# Patient Record
Sex: Female | Born: 1960 | Hispanic: Yes | Smoking: Never smoker
Health system: Southern US, Community
[De-identification: ages and names within clinical notes are randomized; demographics above are authoritative.]

## PROBLEM LIST (undated history)

## (undated) DIAGNOSIS — I10 Essential (primary) hypertension: Secondary | ICD-10-CM

## (undated) DIAGNOSIS — E119 Type 2 diabetes mellitus without complications: Secondary | ICD-10-CM

## (undated) DIAGNOSIS — F329 Major depressive disorder, single episode, unspecified: Secondary | ICD-10-CM

## (undated) DIAGNOSIS — E039 Hypothyroidism, unspecified: Secondary | ICD-10-CM

## (undated) DIAGNOSIS — T7840XA Allergy, unspecified, initial encounter: Secondary | ICD-10-CM

## (undated) DIAGNOSIS — F32A Depression, unspecified: Secondary | ICD-10-CM

## (undated) HISTORY — DX: Depression, unspecified: F32.A

## (undated) HISTORY — DX: Type 2 diabetes mellitus without complications: E11.9

## (undated) HISTORY — DX: Hypothyroidism, unspecified: E03.9

## (undated) HISTORY — DX: Allergy, unspecified, initial encounter: T78.40XA

## (undated) HISTORY — DX: Major depressive disorder, single episode, unspecified: F32.9

## (undated) HISTORY — DX: Essential (primary) hypertension: I10

---

## 2014-10-26 ENCOUNTER — Encounter: Payer: Self-pay | Admitting: Family Medicine

## 2014-10-26 ENCOUNTER — Ambulatory Visit: Payer: Self-pay | Attending: Family Medicine | Admitting: Family Medicine

## 2014-10-26 VITALS — BP 144/96 | HR 73 | Temp 98.8°F | Resp 16 | Ht 64.0 in | Wt 151.0 lb

## 2014-10-26 DIAGNOSIS — F329 Major depressive disorder, single episode, unspecified: Secondary | ICD-10-CM

## 2014-10-26 DIAGNOSIS — Z114 Encounter for screening for human immunodeficiency virus [HIV]: Secondary | ICD-10-CM

## 2014-10-26 DIAGNOSIS — E039 Hypothyroidism, unspecified: Secondary | ICD-10-CM

## 2014-10-26 DIAGNOSIS — I10 Essential (primary) hypertension: Secondary | ICD-10-CM

## 2014-10-26 DIAGNOSIS — E119 Type 2 diabetes mellitus without complications: Secondary | ICD-10-CM

## 2014-10-26 DIAGNOSIS — E1169 Type 2 diabetes mellitus with other specified complication: Secondary | ICD-10-CM

## 2014-10-26 DIAGNOSIS — E1165 Type 2 diabetes mellitus with hyperglycemia: Secondary | ICD-10-CM

## 2014-10-26 DIAGNOSIS — F32A Depression, unspecified: Secondary | ICD-10-CM | POA: Insufficient documentation

## 2014-10-26 DIAGNOSIS — E785 Hyperlipidemia, unspecified: Secondary | ICD-10-CM

## 2014-10-26 DIAGNOSIS — G44229 Chronic tension-type headache, not intractable: Secondary | ICD-10-CM

## 2014-10-26 DIAGNOSIS — J309 Allergic rhinitis, unspecified: Secondary | ICD-10-CM

## 2014-10-26 LAB — LIPID PANEL
CHOL/HDL RATIO: 4.7 ratio
CHOLESTEROL: 215 mg/dL — AB (ref 0–200)
HDL: 46 mg/dL (ref >=46)
LDL CALC: 145 mg/dL — AB (ref 0–99)
Triglycerides: 118 mg/dL (ref <150)
VLDL: 24 mg/dL (ref 0–40)

## 2014-10-26 LAB — COMPLETE METABOLIC PANEL WITH GFR
ALBUMIN: 4 g/dL (ref 3.5–5.2)
ALK PHOS: 100 U/L (ref 39–117)
ALT: 46 U/L — ABNORMAL HIGH (ref 0–35)
AST: 39 U/L — AB (ref 0–37)
BUN: 23 mg/dL (ref 6–23)
CO2: 23 mEq/L (ref 19–32)
CREATININE: 1.1 mg/dL (ref 0.50–1.10)
Calcium: 9.4 mg/dL (ref 8.4–10.5)
Chloride: 102 mEq/L (ref 96–112)
GFR, Est African American: 65 mL/min
GFR, Est Non African American: 57 mL/min — ABNORMAL LOW
Glucose, Bld: 88 mg/dL (ref 70–99)
POTASSIUM: 4.6 meq/L (ref 3.5–5.3)
Sodium: 137 mEq/L (ref 135–145)
Total Bilirubin: 0.5 mg/dL (ref 0.2–1.2)
Total Protein: 7.4 g/dL (ref 6.0–8.3)

## 2014-10-26 LAB — CBC
HCT: 36.7 % (ref 36.0–46.0)
Hemoglobin: 12.3 g/dL (ref 12.0–15.0)
MCH: 28.7 pg (ref 26.0–34.0)
MCHC: 33.5 g/dL (ref 30.0–36.0)
MCV: 85.7 fL (ref 78.0–100.0)
MPV: 9.6 fL (ref 8.6–12.4)
Platelets: 372 10*3/uL (ref 150–400)
RBC: 4.28 MIL/uL (ref 3.87–5.11)
RDW: 13.9 % (ref 11.5–15.5)
WBC: 6.3 10*3/uL (ref 4.0–10.5)

## 2014-10-26 LAB — POCT GLYCOSYLATED HEMOGLOBIN (HGB A1C): Hemoglobin A1C: 6.3

## 2014-10-26 LAB — GLUCOSE, POCT (MANUAL RESULT ENTRY): POC Glucose: 111 mg/dl — AB (ref 70–99)

## 2014-10-26 LAB — TSH: TSH: 4.115 u[IU]/mL (ref 0.350–4.500)

## 2014-10-26 MED ORDER — TRIAMCINOLONE ACETONIDE 55 MCG/ACT NA AERO: 2.0000 | INHALATION_SPRAY | Freq: Every day | NASAL | Status: DC

## 2014-10-26 MED ORDER — GLUCOSE BLOOD VI STRP: 1.0000 | ORAL_STRIP | Freq: Three times a day (TID) | Status: DC

## 2014-10-26 MED ORDER — TRUEPLUS LANCETS 28G MISC: 1.0000 | Freq: Three times a day (TID) | Status: AC

## 2014-10-26 MED ORDER — PREGABALIN 50 MG PO CAPS: 50.0000 mg | ORAL_CAPSULE | Freq: Two times a day (BID) | ORAL | Status: DC

## 2014-10-26 MED ORDER — TRUERESULT BLOOD GLUCOSE W/DEVICE KIT: 1.0000 | PACK | Freq: Three times a day (TID) | Status: AC

## 2014-10-26 MED ORDER — FEXOFENADINE HCL 180 MG PO TABS: 180.0000 mg | ORAL_TABLET | Freq: Every day | ORAL | Status: DC

## 2014-10-26 NOTE — Progress Notes (Signed)
Patient here to establish care She has not been checking her sugars as her meter is in storage for last several months Due for pap and mammogram Patient c/o of headaches and seasonal allergies Needs referral for dentist [facility] is in process

## 2014-10-26 NOTE — Assessment & Plan Note (Signed)
Diabetes: A1c and blood sugar today are at goal. Continue metformin. Low carb diet  Regular exercise  Monitor sugars, new supplies sent

## 2014-10-26 NOTE — Assessment & Plan Note (Signed)
Headaches: tension type headache. Suspect chronic anxiety and depression is main trigger.  Plan: Check vit D level lyrica 50 mg TID

## 2014-10-26 NOTE — Assessment & Plan Note (Signed)
Allergic rhinitis: nasacort Allegra Stop hydroxyzine

## 2014-10-26 NOTE — Assessment & Plan Note (Signed)
A: on synthroid P: TSH today

## 2014-10-26 NOTE — Progress Notes (Signed)
   Subjective:    Patient ID: Linda George, female    DOB: 09-12-1960, 54 y.o.   MRN: [id number] CC: establish care, DM2, HTN, headaches  HPI  1. DM2: taking metformin [phone] mg BID. No checking sugars. Need diabetic eye exam.   2. HTN: taking HCTZ and losartan at night. ROS as per above. Not compliant with low salt diet.  3. Allergic rhinitis: taking hydroxyzine. Coughing and congested x 2 weeks. Took claritin in the past it did not help. Took zyrtec for many years it helped then stopped helping. Never tried nasal steroid.   4. Headaches: b/l temples since 03/2014. Daily. Moderate to severe. Better with excedrin and trial of lyrica. Worse with stress. Has hx of anxiety and depressing. Treated at [facility] with wellbutring and prozac. ROS as per below.   Soc Hx: non smoker  Med Hx: DM2 dx in  Fam Hx: DM2 in parents  Review of Systems  Constitutional: Negative for fever, chills and activity change.  HENT: Positive for congestion and sinus pressure. Negative for ear discharge, ear pain, facial swelling, hearing loss and mouth sores.   Eyes: Positive for visual disturbance.  Respiratory: Negative.   Cardiovascular: Negative.   Gastrointestinal: Negative.   Genitourinary: Negative.   Skin: Negative.   Neurological: Positive for light-headedness and headaches. Negative for dizziness, tremors, speech difficulty, weakness and numbness.  Psychiatric/Behavioral: Positive for dysphoric mood. The patient is nervous/anxious.    GAD-7: score of  12. 2-1,3,7. 3-2,3.     Objective:   Physical Exam BP 144/96 mmHg  Pulse 73  Temp(Src) 98.8 F (37.1 C)  Resp 16  Ht 5\' 4"  (1.626 m)  Wt 151 lb (68.493 kg)  BMI 25.91 kg/m2  SpO2 97% General appearance: alert, cooperative and no distress Head: Normocephalic, without obvious abnormality, atraumatic Eyes: conjunctivae/corneas clear. PERRL, EOM's intact.  Ears: normal TM's and external ear canals both ears Nose: no discharge, turbinates pink,  swollen  Throat: normal findings: lips normal without lesions and oropharynx pink & moist without lesions or evidence of thrush and abnormal findings: dentition: poor and periodontitis Neck: no adenopathy, supple, symmetrical, trachea midline and thyroid not enlarged, symmetric, no tenderness/mass/nodules Lungs: clear to auscultation bilaterally Heart: regular rate and rhythm, S1, S2 normal, no murmur, click, rub or gallop Extremities: extremities normal, atraumatic, no cyanosis or edema Pulses: 2+ and symmetric Skin: Skin color, texture, turgor normal. No rashes or lesions Neurologic: Alert and oriented X 3, normal strength and tone. Normal symmetric reflexes. Normal coordination and gait   Lab Results  Component Value Date   HGBA1C 6.30 10/26/2014   CBG 111       Assessment & Plan:

## 2014-10-26 NOTE — Patient Instructions (Addendum)
Linda George,  Thank you for coming in today. It was a pleasure meeting you. I look forward to being your primary doctor.   1. Diabetes: A1c and blood sugar today are at goal. Continue metformin. Low carb diet  Regular exercise  Monitor sugars, new supplies sent   Diabetes  Check blood sugar 2-3 times per day  Goal fasting 90-110 Goal after eating < 160 Beware of hypoglycemia (low blood sugar) which is blood sugar < 70 with or without symptoms  Diet Recommendations for Diabetes   Starchy (carb) foods include: Bread, rice, pasta, potatoes, corn, crackers, bagels, muffins, all baked goods.   Protein foods include: Meat, fish, poultry, eggs, dairy foods, and beans such as pinto and kidney beans (beans also provide carbohydrate).   1. Eat at least 3 meals and 1-2 snacks per day. Never go more than 4-5 hours while awake without eating.  2. Limit starchy foods to TWO per meal and ONE per snack. ONE portion of a starchy  food is equal to the following:   - ONE slice of bread (or its equivalent, such as half of a hamburger bun).   - 1/2 cup of a "scoopable" starchy food such as potatoes or rice.   - 1 OUNCE (28 grams) of starchy snack foods such as crackers or pretzels (look on label).   - 15 grams of carbohydrate as shown on food label.  3. Both lunch and dinner should include a protein food, a carb food, and vegetables.   - Obtain twice as many veg's as protein or carbohydrate foods for both lunch and dinner.   - Try to keep frozen veg's on hand for a quick vegetable serving.     - Fresh or frozen veg's are best.  4. Breakfast should always include protein.     2. HTN: goal is < 140/90  Continue HCTZ 25 mg daily Continue losartan 50 mg daily  DASH diet   3. Headaches: tension type headache. Suspect chronic anxiety and depression is main trigger.  Plan: Check vit D level lyrica 50 mg TID  4. Allergic rhinitis: nasacort Allegra Stop hydroxyzine   F/u in 4 weeks for pap  smear and discuss foot pain   Dr. Adrian Blackwater   DASH Eating Plan DASH stands for "Dietary Approaches to Stop Hypertension." The DASH eating plan is a healthy eating plan that has been shown to reduce high blood pressure (hypertension). Additional health benefits may include reducing the risk of type 2 diabetes mellitus, heart disease, and stroke. The DASH eating plan may also help with weight loss. WHAT DO I NEED TO KNOW ABOUT THE DASH EATING PLAN? For the DASH eating plan, you will follow these general guidelines:  Choose foods with a percent daily value for sodium of less than 5% (as listed on the food label).  Use salt-free seasonings or herbs instead of table salt or sea salt.  Check with your health care provider or pharmacist before using salt substitutes.  Eat lower-sodium products, often labeled as "lower sodium" or "no salt added."  Eat fresh foods.  Eat more vegetables, fruits, and low-fat dairy products.  Choose whole grains. Look for the word "whole" as the first word in the ingredient list.  Choose fish and skinless chicken or [place] more often than red meat. Limit fish, poultry, and meat to 6 oz (170 g) each day.  Limit sweets, desserts, sugars, and sugary drinks.  Choose heart-healthy fats.  Limit cheese to 1 oz (28 g) per day.  Eat more home-cooked food and less restaurant, buffet, and fast food.  Limit fried foods.  Cook foods using methods other than frying.  Limit canned vegetables. If you do use them, rinse them well to decrease the sodium.  When eating at a restaurant, ask that your food be prepared with less salt, or no salt if possible. WHAT FOODS CAN I EAT? Seek help from a dietitian for individual calorie needs. Grains Whole grain or whole wheat bread. Brown rice. Whole grain or whole wheat pasta. Quinoa, bulgur, and whole grain cereals. Low-sodium cereals. Corn or whole wheat flour tortillas. Whole grain cornbread. Whole grain crackers. Low-sodium  crackers. Vegetables Fresh or frozen vegetables (raw, steamed, roasted, or grilled). Low-sodium or reduced-sodium tomato and vegetable juices. Low-sodium or reduced-sodium tomato sauce and paste. Low-sodium or reduced-sodium canned vegetables.  Fruits All fresh, canned (in natural juice), or frozen fruits. Meat and Other Protein Products Ground beef (85% or leaner), grass-fed beef, or beef trimmed of fat. Skinless chicken or [place]. Ground chicken or [place]. Pork trimmed of fat. All fish and seafood. Eggs. Dried beans, peas, or lentils. Unsalted nuts and seeds. Unsalted canned beans. Dairy Low-fat dairy products, such as skim or 1% milk, 2% or reduced-fat cheeses, low-fat ricotta or cottage cheese, or plain low-fat yogurt. Low-sodium or reduced-sodium cheeses. Fats and Oils Tub margarines without trans fats. Light or reduced-fat mayonnaise and salad dressings (reduced sodium). Avocado. Safflower, olive, or canola oils. Natural peanut or almond butter. Other Unsalted popcorn and pretzels. The items listed above may not be a complete list of recommended foods or beverages. Contact your dietitian for more options. WHAT FOODS ARE NOT RECOMMENDED? Grains White bread. White pasta. White rice. Refined cornbread. Bagels and croissants. Crackers that contain trans fat. Vegetables Creamed or fried vegetables. Vegetables in a cheese sauce. Regular canned vegetables. Regular canned tomato sauce and paste. Regular tomato and vegetable juices. Fruits Dried fruits. Canned fruit in light or heavy syrup. Fruit juice. Meat and Other Protein Products Fatty cuts of meat. Ribs, chicken wings, bacon, sausage, bologna, salami, chitterlings, fatback, hot dogs, bratwurst, and packaged luncheon meats. Salted nuts and seeds. Canned beans with salt. Dairy Whole or 2% milk, cream, half-and-half, and cream cheese. Whole-fat or sweetened yogurt. Full-fat cheeses or blue cheese. Nondairy creamers and whipped toppings.  Processed cheese, cheese spreads, or cheese curds. Condiments Onion and garlic salt, seasoned salt, table salt, and sea salt. Canned and packaged gravies. Worcestershire sauce. Tartar sauce. Barbecue sauce. Teriyaki sauce. Soy sauce, including reduced sodium. Steak sauce. Fish sauce. Oyster sauce. Cocktail sauce. Horseradish. Ketchup and mustard. Meat flavorings and tenderizers. Bouillon cubes. Hot sauce. Tabasco sauce. Marinades. Taco seasonings. Relishes. Fats and Oils Butter, stick margarine, lard, shortening, ghee, and bacon fat. Coconut, palm kernel, or palm oils. Regular salad dressings. Other Pickles and olives. Salted popcorn and pretzels. The items listed above may not be a complete list of foods and beverages to avoid. Contact your dietitian for more information. WHERE CAN I FIND MORE INFORMATION? National Heart, Lung, and Blood Institute: travelstabloid.com Document Released: 07/09/2011 Document Revised: 12/04/2013 Document Reviewed: 05/24/2013 [facility] Patient Information 2015 [place], [place]. This information is not intended to replace advice given to you by your health care provider. Make sure you discuss any questions you have with your health care provider.

## 2014-10-26 NOTE — Assessment & Plan Note (Addendum)
Screening HIV  Screening HIV, negative

## 2014-10-26 NOTE — Assessment & Plan Note (Signed)
A: HTN: goal is < 140/90. Just above goal  P:  Continue HCTZ 25 mg daily Continue losartan 50 mg daily  DASH diet

## 2014-10-27 LAB — MICROALBUMIN / CREATININE URINE RATIO
CREATININE, URINE: 95 mg/dL
MICROALB UR: 0.7 mg/dL (ref <2.0)
Microalb Creat Ratio: 7.4 mg/g (ref 0.0–30.0)

## 2014-10-27 LAB — HIV ANTIBODY (ROUTINE TESTING W REFLEX): HIV 1&2 Ab, 4th Generation: NONREACTIVE

## 2014-11-01 ENCOUNTER — Ambulatory Visit: Payer: Self-pay | Attending: Internal Medicine

## 2014-11-01 DIAGNOSIS — E1169 Type 2 diabetes mellitus with other specified complication: Secondary | ICD-10-CM | POA: Insufficient documentation

## 2014-11-01 DIAGNOSIS — E785 Hyperlipidemia, unspecified: Secondary | ICD-10-CM

## 2014-11-01 MED ORDER — ATORVASTATIN CALCIUM 40 MG PO TABS: 40.0000 mg | ORAL_TABLET | Freq: Every day | ORAL | Status: DC

## 2014-11-01 MED ORDER — LEVOTHYROXINE SODIUM 25 MCG PO TABS: 25.0000 ug | ORAL_TABLET | Freq: Every day | ORAL | Status: DC

## 2014-11-01 NOTE — Assessment & Plan Note (Addendum)
A: elevated lipids with DM2 and hypothyroidism, high risk for CAD  P: lipitor 40 mg daily

## 2014-11-01 NOTE — Addendum Note (Signed)
Addended by: Boykin Nearing on: 11/01/2014 09:16 AM   Modules accepted: Orders

## 2014-11-07 ENCOUNTER — Telehealth: Payer: Self-pay | Admitting: *Deleted

## 2014-11-07 DIAGNOSIS — E785 Hyperlipidemia, unspecified: Principal | ICD-10-CM

## 2014-11-07 DIAGNOSIS — E1169 Type 2 diabetes mellitus with other specified complication: Secondary | ICD-10-CM

## 2014-11-07 NOTE — Telephone Encounter (Signed)
Pt requesting Rx send to [facility]  Phone # [phone]  Pt aware of results

## 2014-11-07 NOTE — Telephone Encounter (Signed)
-----   Message from Boykin Nearing, MD sent at 11/01/2014  9:14 AM EDT ----- Slightly elevated AST/ALT (liver enzymes)  just slight, will monitor  Normal CBC, TSH, screening HIV negative  Elevated cholesterol will need statin given patient also has DM2 to help prevent heart attack and stroke, lipitor 40 mg daily

## 2014-11-12 ENCOUNTER — Other Ambulatory Visit: Payer: Self-pay | Admitting: *Deleted

## 2014-11-12 DIAGNOSIS — E039 Hypothyroidism, unspecified: Secondary | ICD-10-CM

## 2014-11-12 DIAGNOSIS — G44229 Chronic tension-type headache, not intractable: Secondary | ICD-10-CM

## 2014-11-12 MED ORDER — LEVOTHYROXINE SODIUM 25 MCG PO TABS: 25.0000 ug | ORAL_TABLET | Freq: Every day | ORAL | Status: DC

## 2014-11-12 MED ORDER — PREGABALIN 50 MG PO CAPS: 50.0000 mg | ORAL_CAPSULE | Freq: Two times a day (BID) | ORAL | Status: DC

## 2014-11-23 ENCOUNTER — Ambulatory Visit: Payer: Self-pay | Admitting: Family Medicine

## 2015-01-03 ENCOUNTER — Ambulatory Visit: Payer: Self-pay

## 2015-01-03 ENCOUNTER — Encounter: Payer: Self-pay | Admitting: Family Medicine

## 2015-01-03 ENCOUNTER — Telehealth: Payer: Self-pay | Admitting: Family Medicine

## 2015-01-03 ENCOUNTER — Ambulatory Visit: Payer: No Typology Code available for payment source | Attending: Family Medicine | Admitting: Family Medicine

## 2015-01-03 VITALS — BP 133/85 | HR 77 | Temp 98.6°F | Resp 18 | Ht 64.0 in | Wt 149.0 lb

## 2015-01-03 DIAGNOSIS — I10 Essential (primary) hypertension: Secondary | ICD-10-CM

## 2015-01-03 DIAGNOSIS — E1169 Type 2 diabetes mellitus with other specified complication: Secondary | ICD-10-CM

## 2015-01-03 DIAGNOSIS — E119 Type 2 diabetes mellitus without complications: Secondary | ICD-10-CM

## 2015-01-03 DIAGNOSIS — E039 Hypothyroidism, unspecified: Secondary | ICD-10-CM | POA: Diagnosis not present

## 2015-01-03 DIAGNOSIS — R51 Headache: Secondary | ICD-10-CM | POA: Insufficient documentation

## 2015-01-03 DIAGNOSIS — J309 Allergic rhinitis, unspecified: Secondary | ICD-10-CM

## 2015-01-03 DIAGNOSIS — Z0000 Encounter for general adult medical examination without abnormal findings: Secondary | ICD-10-CM

## 2015-01-03 DIAGNOSIS — G44229 Chronic tension-type headache, not intractable: Secondary | ICD-10-CM

## 2015-01-03 DIAGNOSIS — R42 Dizziness and giddiness: Secondary | ICD-10-CM

## 2015-01-03 DIAGNOSIS — E785 Hyperlipidemia, unspecified: Secondary | ICD-10-CM

## 2015-01-03 LAB — TSH: TSH: 17.844 u[IU]/mL — ABNORMAL HIGH (ref 0.350–4.500)

## 2015-01-03 LAB — GLUCOSE, POCT (MANUAL RESULT ENTRY): POC Glucose: 226 mg/dl — AB (ref 70–99)

## 2015-01-03 LAB — POCT GLYCOSYLATED HEMOGLOBIN (HGB A1C): Hemoglobin A1C: 6.3

## 2015-01-03 MED ORDER — LEVOTHYROXINE SODIUM 25 MCG PO TABS: 25.0000 ug | ORAL_TABLET | Freq: Every day | ORAL | Status: DC

## 2015-01-03 MED ORDER — METFORMIN HCL 1000 MG PO TABS: 500.0000 mg | ORAL_TABLET | Freq: Two times a day (BID) | ORAL | Status: DC

## 2015-01-03 MED ORDER — LOSARTAN POTASSIUM 50 MG PO TABS: 50.0000 mg | ORAL_TABLET | Freq: Every day | ORAL | Status: DC

## 2015-01-03 MED ORDER — FLUTICASONE PROPIONATE 50 MCG/ACT NA SUSP: 2.0000 | Freq: Every day | NASAL | Status: DC

## 2015-01-03 MED ORDER — ATORVASTATIN CALCIUM 40 MG PO TABS: 40.0000 mg | ORAL_TABLET | Freq: Every day | ORAL | Status: DC

## 2015-01-03 NOTE — Progress Notes (Signed)
F/U DM Complaining HA daily and dizziness

## 2015-01-03 NOTE — Telephone Encounter (Signed)
Patient called requesting to receive medications filled every 3 months instead of every month. Please f/u with patient

## 2015-01-03 NOTE — Patient Instructions (Addendum)
Linda George,  Thank you for coming in today  1. Diabetes well controlled  2. HTN well controlled  3. Headache with intermittent dizziness: normal neuro exam Opthalmology referral Check vit D Ordered flonase   4. Hypothyroidism: Synthroid refilled  TSH today  F/u in 3 months for HTN, diabetes  Dr. Adrian Blackwater

## 2015-01-03 NOTE — Progress Notes (Signed)
   Subjective:    Patient ID: Linda George, female    DOB: 1960/10/19, 54 y.o.   MRN: [id number] CC: DM2 f.u, HA and dizziness  HPI 54 yo F with depression and history of chronic headaches presents for the following:  1. Headache: worsening over past 1 year. B/l temple. Tightness an sharp pains. Has hx of chronic headache for past 10 yrs. Worse in the morning and while sleeping. Has associated dizziness. Has blurred vision. Worse in R eye. Last eye exam 2-3 years ago. No associated fever, chills, nausea. excedrin and rest helps intermittently.   2. CHRONIC DIABETES  Disease Monitoring  Blood Sugar Ranges: not checking   Polyuria: no   Visual problems: no   Medication Compliance: intermittent Medication Side Effects  Hypoglycemia: no   Preventitive Health Care  Eye Exam: due   Foot Exam: done today   Diet pattern: regular meals   Exercise: no   3. HTN: compliant with regimen. Tolerating losartan w/o swelling. No CP or SOB.   Soc Hx: non smoker  Review of Systems     Objective:   Physical Exam BP 133/85 mmHg  Pulse 77  Temp(Src) 98.6 F (37 C) (Oral)  Resp 18  Ht 5\' 4"  (1.626 m)  Wt 149 lb (67.586 kg)  BMI 25.56 kg/m2  SpO2 100% General appearance: alert, cooperative and no distress Head: Normocephalic, without obvious abnormality, atraumatic Eyes: conjunctivae/corneas clear. PERRL, EOM's intact.  Ears: normal TM's and external ear canals both ears  Nose: turbinates pink and swollen, no discharge  Neck: no adenopathy, no JVD and thyroid not enlarged, symmetric, no tenderness/mass/nodules Lungs: clear to auscultation bilaterally Heart: regular rate and rhythm, S1, S2 normal, no murmur, click, rub or gallop  Lab Results  Component Value Date   HGBA1C 6.30 01/03/2015   CBG 229        Assessment & Plan:

## 2015-01-04 ENCOUNTER — Encounter: Payer: Self-pay | Admitting: Family Medicine

## 2015-01-04 DIAGNOSIS — Z0000 Encounter for general adult medical examination without abnormal findings: Secondary | ICD-10-CM | POA: Insufficient documentation

## 2015-01-04 LAB — VITAMIN D 25 HYDROXY (VIT D DEFICIENCY, FRACTURES): Vit D, 25-Hydroxy: 19 ng/mL — ABNORMAL LOW (ref 30–100)

## 2015-01-04 MED ORDER — VITAMIN D (ERGOCALCIFEROL) 1.25 MG (50000 UNIT) PO CAPS: 50000.0000 [IU] | ORAL_CAPSULE | ORAL | Status: DC

## 2015-01-04 NOTE — Assessment & Plan Note (Signed)
A: Headache with intermittent dizziness: normal neuro exam. Chronic tensio type headache P: Opthalmology referral Check vit D Ordered flonase

## 2015-01-04 NOTE — Assessment & Plan Note (Signed)
A: hypothyroidism with non-compliance with synthroid Lab Results  Component Value Date   TSH 17.844* 01/03/2015   P: Restart synthroid as previous dose

## 2015-01-04 NOTE — Assessment & Plan Note (Signed)
A; well controlled with intermittent compliance P: continue current regimen Ophthalmology referral

## 2015-01-04 NOTE — Assessment & Plan Note (Signed)
A: lightheaded in setting of untreated hypothyroidism and vit D deficiency P: Synthroid Vit D per orders

## 2015-01-04 NOTE — Assessment & Plan Note (Signed)
A: Blood pressure at goal. Meds: compliant  P: I will continue the patient's current medication regimen since her blood pressure is at goal.   

## 2015-01-07 MED ORDER — GLUCOSE BLOOD VI STRP: 1.0000 | ORAL_STRIP | Freq: Three times a day (TID) | Status: DC

## 2015-01-07 MED ORDER — FLUTICASONE PROPIONATE 50 MCG/ACT NA SUSP: 2.0000 | Freq: Every day | NASAL | Status: DC

## 2015-01-07 MED ORDER — METFORMIN HCL 1000 MG PO TABS: 500.0000 mg | ORAL_TABLET | Freq: Two times a day (BID) | ORAL | Status: DC

## 2015-01-07 MED ORDER — LOSARTAN POTASSIUM 50 MG PO TABS: 50.0000 mg | ORAL_TABLET | Freq: Every day | ORAL | Status: DC

## 2015-01-07 MED ORDER — FEXOFENADINE HCL 180 MG PO TABS: 180.0000 mg | ORAL_TABLET | Freq: Every day | ORAL | Status: DC

## 2015-01-07 MED ORDER — HYDROCHLOROTHIAZIDE 25 MG PO TABS: 25.0000 mg | ORAL_TABLET | Freq: Every day | ORAL | Status: DC

## 2015-01-07 MED ORDER — LEVOTHYROXINE SODIUM 25 MCG PO TABS: 25.0000 ug | ORAL_TABLET | Freq: Every day | ORAL | Status: DC

## 2015-01-14 ENCOUNTER — Telehealth: Payer: Self-pay | Admitting: *Deleted

## 2015-01-14 NOTE — Telephone Encounter (Signed)
-----   Message from Boykin Nearing, MD sent at 01/04/2015  8:42 AM EDT ----- Vit D is low, will replace  TSH is high, off synthroid x 2 weeks, synthroid restarted will need repeat TSH in 12 weeks

## 2015-01-14 NOTE — Telephone Encounter (Signed)
LVM to return call.

## 2015-01-15 NOTE — Telephone Encounter (Signed)
Pt called returning nurse's call °

## 2015-01-22 NOTE — Telephone Encounter (Signed)
Pt aware of result.

## 2015-03-18 ENCOUNTER — Telehealth: Payer: Self-pay | Admitting: Family Medicine

## 2015-03-18 DIAGNOSIS — F4310 Post-traumatic stress disorder, unspecified: Secondary | ICD-10-CM

## 2015-03-18 DIAGNOSIS — R5382 Chronic fatigue, unspecified: Secondary | ICD-10-CM

## 2015-03-18 NOTE — Telephone Encounter (Signed)
Patient called requesting to speak to PCP regarding disability letter, patient would like to know if we would be able to provide a letter for her. Please f/u

## 2015-03-19 ENCOUNTER — Ambulatory Visit
Admission: RE | Admit: 2015-03-19 | Discharge: 2015-03-19 | Disposition: A | Discharge status: Home / Self Care | Payer: Medicaid Other | Source: Ambulatory Visit | Attending: Family Medicine | Admitting: Family Medicine

## 2015-03-19 DIAGNOSIS — Z0000 Encounter for general adult medical examination without abnormal findings: Secondary | ICD-10-CM

## 2015-03-20 NOTE — Telephone Encounter (Signed)
Patient called requesting to speak to nurse regarding disability letter, patient would like to know if we would be able to provide one for her,please f/u

## 2015-03-27 ENCOUNTER — Encounter: Payer: Self-pay | Admitting: Family Medicine

## 2015-03-27 ENCOUNTER — Ambulatory Visit: Payer: Medicaid Other | Attending: Family Medicine | Admitting: Family Medicine

## 2015-03-27 ENCOUNTER — Other Ambulatory Visit: Payer: Self-pay | Admitting: Gastroenterology

## 2015-03-27 VITALS — BP 128/84 | HR 75 | Temp 98.7°F | Resp 16 | Ht 64.5 in | Wt 151.0 lb

## 2015-03-27 DIAGNOSIS — E559 Vitamin D deficiency, unspecified: Secondary | ICD-10-CM | POA: Insufficient documentation

## 2015-03-27 DIAGNOSIS — L659 Nonscarring hair loss, unspecified: Secondary | ICD-10-CM | POA: Diagnosis not present

## 2015-03-27 DIAGNOSIS — G44229 Chronic tension-type headache, not intractable: Secondary | ICD-10-CM

## 2015-03-27 DIAGNOSIS — E119 Type 2 diabetes mellitus without complications: Secondary | ICD-10-CM | POA: Diagnosis not present

## 2015-03-27 DIAGNOSIS — I10 Essential (primary) hypertension: Secondary | ICD-10-CM | POA: Diagnosis not present

## 2015-03-27 DIAGNOSIS — J309 Allergic rhinitis, unspecified: Secondary | ICD-10-CM

## 2015-03-27 DIAGNOSIS — E039 Hypothyroidism, unspecified: Secondary | ICD-10-CM

## 2015-03-27 LAB — TSH: TSH: 9.248 u[IU]/mL — ABNORMAL HIGH (ref 0.350–4.500)

## 2015-03-27 LAB — GLUCOSE, POCT (MANUAL RESULT ENTRY): POC Glucose: 176 mg/dl — AB (ref 70–99)

## 2015-03-27 MED ORDER — LEVOTHYROXINE SODIUM 25 MCG PO TABS: 25.0000 ug | ORAL_TABLET | Freq: Every day | ORAL | Status: DC

## 2015-03-27 MED ORDER — PREGABALIN 50 MG PO CAPS: 50.0000 mg | ORAL_CAPSULE | Freq: Two times a day (BID) | ORAL | Status: DC

## 2015-03-27 MED ORDER — HYDROCHLOROTHIAZIDE 25 MG PO TABS: 25.0000 mg | ORAL_TABLET | Freq: Every day | ORAL | Status: DC

## 2015-03-27 MED ORDER — CETIRIZINE HCL 10 MG PO TABS: 10.0000 mg | ORAL_TABLET | Freq: Every day | ORAL | Status: DC

## 2015-03-27 MED ORDER — FLUOXETINE HCL 40 MG PO CAPS: 40.0000 mg | ORAL_CAPSULE | Freq: Every day | ORAL | Status: DC

## 2015-03-27 MED ORDER — LOSARTAN POTASSIUM 50 MG PO TABS
50.0000 mg | ORAL_TABLET | Freq: Every day | ORAL | Status: DC
Start: 2015-03-27 — End: 2015-09-13

## 2015-03-27 MED ORDER — METFORMIN HCL 1000 MG PO TABS: 500.0000 mg | ORAL_TABLET | Freq: Two times a day (BID) | ORAL | Status: DC

## 2015-03-27 NOTE — Patient Instructions (Addendum)
Ms. Linda George, Jha to see you again All meds the same except change from allegra to zyrtec  Derm referral for hairloss  F/u next month for flu shot   F/u with me in 2 months for diabetes, hypothyroidism and HTN

## 2015-03-27 NOTE — Assessment & Plan Note (Signed)
A: well controlled with last A1c check. High sugar today following sugar sweetened cereal P: avoid sugar sweetened cereal  F/u A1c at next OV in 2 months

## 2015-03-27 NOTE — Progress Notes (Signed)
   Subjective:    Patient ID: Linda George, female    DOB: [birth year], 54 y.o.   MRN: [id number] CC: f/u HTN and DM2 HPI  1. CHRONIC HYPERTENSION  Disease Monitoring  Blood pressure range: not checking   Chest pain: no   Dyspnea: no   Claudication: no   Medication compliance: yes  Medication Side Effects  Lightheadedness: no   Urinary frequency: no   Edema: no     2. CHRONIC DIABETES  Disease Monitoring  Blood Sugar Ranges: not checking   Polyuria: no   Visual problems: no   Medication Compliance: yes  Medication Side Effects  Hypoglycemia: no    3. Applying for disability: chronic fatigue, dizziness, lightheadedness, forgetfulness x 2 years. Not working x 1 years. Has hypothyroidism and depression treated at [facility].   4. Hairloss: no scarring for many years. Unable to afford OTC treatment. Has hypothyroidism. Would like treatment for hairloss.   Social History  Substance Use Topics  . Smoking status: Never Smoker   . Smokeless tobacco: Never Used  . Alcohol Use: No   Review of Systems  Constitutional: Positive for fatigue. Negative for fever and chills.  Eyes: Negative for visual disturbance.  Respiratory: Negative for shortness of breath.   Cardiovascular: Negative for chest pain.  Gastrointestinal: Negative for abdominal pain and blood in stool.  Musculoskeletal: Positive for back pain. Negative for arthralgias.  Skin: Negative for rash.       Skin itching legs, arms, back, stomach   Allergic/Immunologic: Negative for immunocompromised state.  Neurological: Positive for dizziness and light-headedness.  Hematological: Negative for adenopathy. Does not bruise/bleed easily.  Psychiatric/Behavioral: Positive for dysphoric mood. Negative for suicidal ideas.  GAD-7: 15. 1-6, 2-2,4,5,7. 301,3.     Objective:   Physical Exam  Constitutional: She is oriented to person, place, and time. She appears well-developed and well-nourished. No distress.  HENT:  Head:  Normocephalic and atraumatic.  Thinning hair and scattered alopecia   Neck: No thyromegaly present.  Cardiovascular: Normal rate, regular rhythm, normal heart sounds and intact distal pulses.   Pulmonary/Chest: Effort normal and breath sounds normal.  Musculoskeletal: She exhibits no edema.  Lymphadenopathy:    She has no cervical adenopathy.  Neurological: She is alert and oriented to person, place, and time.  Skin: Skin is warm and dry. No rash noted.  Psychiatric: She has a normal mood and affect.  BP 128/84 mmHg  Pulse 75  Temp(Src) 98.7 F (37.1 C) (Oral)  Resp 16  Ht 5' 4.5" (1.638 m)  Wt 151 lb (68.493 kg)  BMI 25.53 kg/m2  SpO2 100%  Wt Readings from Last 3 Encounters:  03/27/15 151 lb (68.493 kg)  01/03/15 149 lb (67.586 kg)  10/26/14 151 lb (68.493 kg)    CBG 176 Lab Results  Component Value Date   HGBA1C 6.30 01/03/2015          Assessment & Plan:

## 2015-03-27 NOTE — Assessment & Plan Note (Signed)
Changes from allegra to zyrtec Plan for zyrtec D in allergy season prn

## 2015-03-27 NOTE — Assessment & Plan Note (Addendum)
Checking TSH and will adjust synthroid as needed.  TSH improved but still elevated increase synthroid to 50 mcg q AM

## 2015-03-27 NOTE — Progress Notes (Signed)
F/U HTN DM  Stated BP is been elevated 149/70 No Hx tobacco

## 2015-03-27 NOTE — Assessment & Plan Note (Signed)
A: BP well controlled Med: compliant P: continue HCTZ

## 2015-03-27 NOTE — Addendum Note (Signed)
Addended by: Boykin Nearing on: 03/27/2015 01:16 PM   Modules accepted: Orders

## 2015-03-27 NOTE — Assessment & Plan Note (Signed)
Derm referral placed

## 2015-03-28 LAB — VITAMIN D 25 HYDROXY (VIT D DEFICIENCY, FRACTURES): VIT D 25 HYDROXY: 37 ng/mL (ref 30–100)

## 2015-03-29 MED ORDER — LEVOTHYROXINE SODIUM 50 MCG PO TABS: 50.0000 ug | ORAL_TABLET | Freq: Every day | ORAL | Status: DC

## 2015-03-29 NOTE — Addendum Note (Signed)
Addended by: Boykin Nearing on: 03/29/2015 10:48 AM   Modules accepted: Orders

## 2015-04-01 ENCOUNTER — Telehealth: Payer: Self-pay | Admitting: Clinical

## 2015-04-01 NOTE — Telephone Encounter (Signed)
Left HIPPA-compliant to call back Roselyn Reef from [employer] at [phone], attempt to f/u w pt.

## 2015-04-10 DIAGNOSIS — F4310 Post-traumatic stress disorder, unspecified: Secondary | ICD-10-CM | POA: Insufficient documentation

## 2015-04-10 DIAGNOSIS — R5382 Chronic fatigue, unspecified: Secondary | ICD-10-CM | POA: Insufficient documentation

## 2015-04-10 NOTE — Telephone Encounter (Signed)
Please call patient, I do not determine long term disability but I have written a letter detailing her current active medical problems. Letter ready for pick up

## 2015-04-10 NOTE — Telephone Encounter (Signed)
Date of  Birth verified by Pt Lab results given to pt Notified Rx was increased. New Rx at [place]

## 2015-04-10 NOTE — Telephone Encounter (Signed)
-----   Message from Boykin Nearing, MD sent at 03/29/2015 10:44 AM EDT ----- Vit D normal TSH improved but still elevated increase synthroid to 50 mcg q AM

## 2015-04-10 NOTE — Telephone Encounter (Signed)
LVM to return call, Letter at front office    Please inform Pt letter is ready at front office

## 2015-06-04 ENCOUNTER — Encounter (HOSPITAL_COMMUNITY): Payer: Self-pay | Admitting: *Deleted

## 2015-06-11 ENCOUNTER — Encounter (HOSPITAL_COMMUNITY): Payer: Self-pay

## 2015-06-11 ENCOUNTER — Ambulatory Visit (HOSPITAL_COMMUNITY)
Admission: RE | Admit: 2015-06-11 | Discharge: 2015-06-11 | Disposition: A | Discharge status: Home / Self Care | Payer: Medicaid Other | Source: Ambulatory Visit | Attending: Gastroenterology | Admitting: Gastroenterology

## 2015-06-11 ENCOUNTER — Encounter (HOSPITAL_COMMUNITY)
Admission: RE | Disposition: A | Discharge status: Home / Self Care | Payer: Self-pay | Source: Ambulatory Visit | Attending: Gastroenterology

## 2015-06-11 ENCOUNTER — Ambulatory Visit (HOSPITAL_COMMUNITY): Payer: Medicaid Other | Admitting: Anesthesiology

## 2015-06-11 DIAGNOSIS — E119 Type 2 diabetes mellitus without complications: Secondary | ICD-10-CM | POA: Insufficient documentation

## 2015-06-11 DIAGNOSIS — E039 Hypothyroidism, unspecified: Secondary | ICD-10-CM | POA: Insufficient documentation

## 2015-06-11 DIAGNOSIS — Z1211 Encounter for screening for malignant neoplasm of colon: Secondary | ICD-10-CM | POA: Diagnosis not present

## 2015-06-11 DIAGNOSIS — F4310 Post-traumatic stress disorder, unspecified: Secondary | ICD-10-CM | POA: Diagnosis not present

## 2015-06-11 DIAGNOSIS — I10 Essential (primary) hypertension: Secondary | ICD-10-CM | POA: Insufficient documentation

## 2015-06-11 DIAGNOSIS — E7800 Pure hypercholesterolemia, unspecified: Secondary | ICD-10-CM | POA: Diagnosis not present

## 2015-06-11 DIAGNOSIS — D122 Benign neoplasm of ascending colon: Secondary | ICD-10-CM | POA: Diagnosis not present

## 2015-06-11 HISTORY — PX: COLONOSCOPY WITH PROPOFOL: SHX5780

## 2015-06-11 LAB — GLUCOSE, CAPILLARY: Glucose-Capillary: 88 mg/dL (ref 65–99)

## 2015-06-11 SURGERY — COLONOSCOPY WITH PROPOFOL
Anesthesia: Monitor Anesthesia Care

## 2015-06-11 MED ORDER — PROPOFOL 10 MG/ML IV BOLUS
INTRAVENOUS | Status: DC | PRN
  Administered 2015-06-11 (×3): 20 mg via INTRAVENOUS

## 2015-06-11 MED ORDER — PROPOFOL 10 MG/ML IV BOLUS
INTRAVENOUS | Status: AC
  Filled 2015-06-11: qty 20

## 2015-06-11 MED ORDER — LACTATED RINGERS IV SOLN
INTRAVENOUS | Status: DC
  Administered 2015-06-11: 12:00:00 via INTRAVENOUS

## 2015-06-11 MED ORDER — FENTANYL CITRATE (PF) 100 MCG/2ML IJ SOLN
INTRAMUSCULAR | Status: DC | PRN
  Administered 2015-06-11: 50 ug via INTRAVENOUS

## 2015-06-11 MED ORDER — FENTANYL CITRATE (PF) 100 MCG/2ML IJ SOLN
INTRAMUSCULAR | Status: AC
  Filled 2015-06-11: qty 4

## 2015-06-11 MED ORDER — SODIUM CHLORIDE 0.9 % IV SOLN: INTRAVENOUS | Status: DC

## 2015-06-11 MED ORDER — PROPOFOL 500 MG/50ML IV EMUL
INTRAVENOUS | Status: DC | PRN
  Administered 2015-06-11: 140 ug/kg/min via INTRAVENOUS

## 2015-06-11 SURGICAL SUPPLY — 21 items

## 2015-06-11 NOTE — Op Note (Signed)
Procedure: Screening colonoscopy  Endoscopist: Earle Gell  Premedication: Propofol administered by anesthesia  Procedure: The patient was placed in the left lateral decubitus position. Anal inspection and digital rectal exam were normal. The Pentax pediatric colonoscope was introduced into the rectum and advanced to the cecum. A normal-appearing ileocecal valve and appendiceal orifice were identified. Colonic preparation for the exam today was good. Withdrawal time was 13 minutes  Rectum. Normal. Retroflexed view of the distal rectum was normal  Sigmoid colon and descending colon. Normal  Splenic flexure. Normal  Transverse colon. Normal  Hepatic flexure. Normal  Ascending colon. From the proximal ascending colon, a 5 mm sessile polyp was removed with the cold snare  Cecum and ileocecal valve. Normal  Assessment: A small polyp was removed from the ascending colon. Otherwise normal colonoscopy  Recommendation: If the colon polyp returns adenomatous pathologically, schedule repeat colonoscopy in 5 years

## 2015-06-11 NOTE — Anesthesia Postprocedure Evaluation (Signed)
Anesthesia Post Note  Patient: Linda George  Procedure(s) Performed: Procedure(s) (LRB): COLONOSCOPY WITH PROPOFOL (N/A)  Anesthesia type: MAC  Patient location: PACU  Post pain: Pain level controlled  Post assessment: Patient's Cardiovascular Status Stable  Last Vitals:  Filed Vitals:   06/11/15 1355  BP:   Pulse: 63  Temp:   Resp: 15    Post vital signs: Reviewed and stable  Level of consciousness: sedated  Complications: No apparent anesthesia complications

## 2015-06-11 NOTE — H&P (Signed)
  Procedure: Screening colonoscopy  History: The patient is a 54 year old female born 1960-12-28. She is scheduled to undergo a screening colonoscopy today. There is no family history of colon cancer.  Medication allergies: ACE inhibitors  Past medical history: Depression. Hypertension. Type 2 diabetes mellitus. Hypothyroidism. Hypercholesterolemia. Cesarean section. Post traumatic stress disorder  Exam: The patient is alert and lying comfortably on the endoscopy stretcher. Abdomen is soft and nontender to palpation. Lungs are clear to auscultation. Cardiac exam reveals a regular rhythm.  Plan: Proceed with screening colonoscopy

## 2015-06-11 NOTE — Anesthesia Preprocedure Evaluation (Signed)
Anesthesia Evaluation  Patient identified by MRN, date of birth, ID band Patient awake    Reviewed: Allergy & Precautions, NPO status , Patient's Chart, lab work & pertinent test results  Airway Mallampati: I  TM Distance: >3 FB Neck ROM: Full    Dental   Pulmonary    Pulmonary exam normal        Cardiovascular hypertension, Pt. on medications Normal cardiovascular exam     Neuro/Psych Depression    GI/Hepatic   Endo/Other  diabetes, Type 2, Oral Hypoglycemic AgentsHypothyroidism   Renal/GU      Musculoskeletal   Abdominal   Peds  Hematology   Anesthesia Other Findings   Reproductive/Obstetrics                             Anesthesia Physical Anesthesia Plan  ASA: II  Anesthesia Plan: MAC   Post-op Pain Management:    Induction: Intravenous  Airway Management Planned: Natural Airway  Additional Equipment:   Intra-op Plan:   Post-operative Plan:   Informed Consent: I have reviewed the patients History and Physical, chart, labs and discussed the procedure including the risks, benefits and alternatives for the proposed anesthesia with the patient or authorized representative who has indicated his/her understanding and acceptance.     Plan Discussed with: CRNA and Surgeon  Anesthesia Plan Comments:         Anesthesia Quick Evaluation

## 2015-06-11 NOTE — Discharge Instructions (Signed)

## 2015-06-11 NOTE — Transfer of Care (Signed)
Immediate Anesthesia Transfer of Care Note  Patient: Linda George  Procedure(s) Performed: Procedure(s): COLONOSCOPY WITH PROPOFOL (N/A)  Patient Location: Endoscopy Unit  Anesthesia Type:MAC  Level of Consciousness: awake and alert   Airway & Oxygen Therapy: Patient Spontanous Breathing and Patient connected to face mask oxygen  Post-op Assessment: Report given to RN and Post -op Vital signs reviewed and stable  Post vital signs: Reviewed and stable  Last Vitals:  Filed Vitals:   06/11/15 1143  BP: 179/98  Pulse: 68  Temp: 36.7 C  Resp: 19    Complications: No apparent anesthesia complications

## 2015-06-12 ENCOUNTER — Encounter (HOSPITAL_COMMUNITY): Payer: Self-pay | Admitting: Gastroenterology

## 2015-06-18 ENCOUNTER — Telehealth: Payer: Self-pay | Admitting: *Deleted

## 2015-06-18 ENCOUNTER — Other Ambulatory Visit: Payer: Self-pay | Admitting: *Deleted

## 2015-06-18 DIAGNOSIS — G44229 Chronic tension-type headache, not intractable: Secondary | ICD-10-CM

## 2015-06-18 MED ORDER — PREGABALIN 50 MG PO CAPS: 50.0000 mg | ORAL_CAPSULE | Freq: Two times a day (BID) | ORAL | Status: DC

## 2015-06-18 NOTE — Telephone Encounter (Signed)
LVM to return call   (Pharmacy of choice)

## 2015-09-13 ENCOUNTER — Ambulatory Visit (HOSPITAL_BASED_OUTPATIENT_CLINIC_OR_DEPARTMENT_OTHER): Payer: Medicaid Other | Admitting: Clinical

## 2015-09-13 ENCOUNTER — Ambulatory Visit: Payer: Medicaid Other | Attending: Family Medicine | Admitting: Family Medicine

## 2015-09-13 ENCOUNTER — Encounter: Payer: Self-pay | Admitting: Family Medicine

## 2015-09-13 VITALS — BP 161/90 | HR 77 | Temp 99.4°F | Resp 16 | Ht 64.5 in | Wt 140.0 lb

## 2015-09-13 DIAGNOSIS — E119 Type 2 diabetes mellitus without complications: Secondary | ICD-10-CM | POA: Insufficient documentation

## 2015-09-13 DIAGNOSIS — Z7984 Long term (current) use of oral hypoglycemic drugs: Secondary | ICD-10-CM | POA: Diagnosis not present

## 2015-09-13 DIAGNOSIS — J309 Allergic rhinitis, unspecified: Secondary | ICD-10-CM | POA: Insufficient documentation

## 2015-09-13 DIAGNOSIS — F331 Major depressive disorder, recurrent, moderate: Secondary | ICD-10-CM

## 2015-09-13 DIAGNOSIS — E039 Hypothyroidism, unspecified: Secondary | ICD-10-CM | POA: Insufficient documentation

## 2015-09-13 DIAGNOSIS — I10 Essential (primary) hypertension: Secondary | ICD-10-CM | POA: Diagnosis not present

## 2015-09-13 DIAGNOSIS — Z79899 Other long term (current) drug therapy: Secondary | ICD-10-CM | POA: Insufficient documentation

## 2015-09-13 LAB — POCT GLYCOSYLATED HEMOGLOBIN (HGB A1C): HEMOGLOBIN A1C: 5.6

## 2015-09-13 LAB — TSH: TSH: 5.04 mIU/L — ABNORMAL HIGH

## 2015-09-13 LAB — GLUCOSE, POCT (MANUAL RESULT ENTRY): POC Glucose: 106 mg/dl — AB (ref 70–99)

## 2015-09-13 MED ORDER — HYDROCHLOROTHIAZIDE 12.5 MG PO TABS
12.5000 mg | ORAL_TABLET | Freq: Every day | ORAL | Status: DC
Start: 2015-09-13 — End: 2016-10-05

## 2015-09-13 MED ORDER — CETIRIZINE-PSEUDOEPHEDRINE ER 5-120 MG PO TB12: 1.0000 | ORAL_TABLET | Freq: Two times a day (BID) | ORAL | Status: DC

## 2015-09-13 MED ORDER — LOSARTAN POTASSIUM 100 MG PO TABS: 100.0000 mg | ORAL_TABLET | Freq: Every day | ORAL | Status: DC

## 2015-09-13 MED FILL — FLUoxetine HCL 40 MG CAPS: 40 | 90 days supply | Qty: 90 | Fill #2

## 2015-09-13 MED FILL — LEVOTHYROXINE 50 MCG TABLET: 50 | 60 days supply | Qty: 60 | Fill #2

## 2015-09-13 MED FILL — ALL DAY ALLERGY 10 MG TAB: 10 | 30 days supply | Qty: 30 | Fill #2

## 2015-09-13 MED FILL — LOSARTAN POTASSIUM 100 MG T: 100 | 90 days supply | Qty: 90 | Fill #0

## 2015-09-13 MED FILL — HYDROCHLOROTHIAZIDE 12.5 MG: 12.5 | 90 days supply | Qty: 90 | Fill #0

## 2015-09-13 NOTE — Progress Notes (Signed)
Subjective:  Patient ID: Linda George, female    DOB: Jan 27, 1961  Age: 55 y.o. MRN: [id number]  CC: Hypertension   HPI Linda George present for    1. CHRONIC DIABETES  Disease Monitoring  Blood Sugar Ranges: not checking   Polyuria: no   Visual problems: no   Medication Compliance: yes  Medication Side Effects  Hypoglycemia: no    2. CHRONIC HYPERTENSION  Disease Monitoring  Blood pressure range: not checking   Chest pain: no   Dyspnea: no   Claudication: no   Medication compliance: yes, losartan 50 mg daily   Medication Side Effects  Lightheadedness: yes   Urinary frequency: no   Edema: no     3. Hypothyroidism: taking synthroid. Has chronic fatigue. Has dizziness, lightheadedness. Has lost weight since last OV. 11 lbs.   Social History  Substance Use Topics  . Smoking status: Never Smoker   . Smokeless tobacco: Never Used  . Alcohol Use: No    Outpatient Prescriptions Prior to Visit  Medication Sig Dispense Refill  . Blood Glucose Monitoring Suppl (TRUERESULT BLOOD GLUCOSE) W/DEVICE KIT 1 each by Does not apply route 3 (three) times daily before meals. 1 each 0  . buPROPion (WELLBUTRIN SR) 150 MG 12 hr tablet Take 150 mg by mouth 2 (two) times daily.    . cetirizine (ZYRTEC) 10 MG tablet Take 1 tablet (10 mg total) by mouth daily. 90 tablet 3  . FLUoxetine (PROZAC) 40 MG capsule Take 1 capsule (40 mg total) by mouth daily. 90 capsule 2  . fluticasone (FLONASE) 50 MCG/ACT nasal spray Place 2 sprays into both nostrils daily. (Patient taking differently: Place 2 sprays into both nostrils daily as needed for allergies. ) 48 g 3  . glucose blood (TRUETEST TEST) test strip 1 each by Other route 3 (three) times daily. Use as instructed 300 each 3  . levothyroxine (SYNTHROID, LEVOTHROID) 50 MCG tablet Take 1 tablet (50 mcg total) by mouth daily. 90 tablet 1  . losartan (COZAAR) 50 MG tablet Take 1 tablet (50 mg total) by mouth daily. 90 tablet 3  . metFORMIN  (GLUCOPHAGE) 1000 MG tablet Take 0.5 tablets (500 mg total) by mouth 2 (two) times daily with a meal. 90 tablet 3  . TRUEPLUS LANCETS 28G MISC 1 each by Does not apply route 3 (three) times daily. 100 each 12  . pregabalin (LYRICA) 50 MG capsule Take 1 capsule (50 mg total) by mouth 2 (two) times daily. (Patient not taking: Reported on 09/13/2015) 180 capsule 3   No facility-administered medications prior to visit.    ROS Review of Systems  Constitutional: Positive for fatigue. Negative for fever and chills.  Eyes: Negative for visual disturbance.  Respiratory: Negative for shortness of breath.   Cardiovascular: Negative for chest pain.  Gastrointestinal: Negative for abdominal pain and blood in stool.  Musculoskeletal: Negative for back pain and arthralgias.  Skin: Negative for rash.  Allergic/Immunologic: Negative for immunocompromised state.  Neurological: Positive for dizziness and light-headedness.  Hematological: Negative for adenopathy. Does not bruise/bleed easily.  Psychiatric/Behavioral: Negative for suicidal ideas and dysphoric mood.    Objective:  BP 161/90 mmHg  Pulse 77  Temp(Src) 99.4 F (37.4 C) (Oral)  Resp 16  Ht 5' 4.5" (1.638 m)  Wt 140 lb (63.504 kg)  BMI 23.67 kg/m2  SpO2 98% No data found.  Orthostatic VS for the past 24 hrs (Last 3 readings):  BP- Lying Pulse- Lying BP- Sitting Pulse- Sitting BP- Standing at  0 minutes Pulse- Standing at 0 minutes  09/13/15 1519 144/89 mmHg 80 138/88 mmHg 74 143/87 mmHg 73   Physical Exam  Constitutional: She is oriented to person, place, and time. She appears well-developed and well-nourished. No distress.  HENT:  Head: Normocephalic and atraumatic.  Thinning hair and scattered alopecia   Neck: No thyromegaly present.  Cardiovascular: Normal rate, regular rhythm, normal heart sounds and intact distal pulses.   Pulmonary/Chest: Effort normal and breath sounds normal.  Musculoskeletal: She exhibits no edema.    Lymphadenopathy:    She has no cervical adenopathy.  Neurological: She is alert and oriented to person, place, and time.  Skin: Skin is warm and dry. No rash noted.  Psychiatric: She has a normal mood and affect.   Lab Results  Component Value Date   HGBA1C 5.60 09/13/2015   CBG  106  Assessment & Plan:   Linda George was seen today for hypertension and diabetes.  Diagnoses and all orders for this visit:  Diabetes mellitus without complication ([place]) -     POCT glycosylated hemoglobin (Hb A1C) -     POCT glucose (manual entry) -     Flu Vaccine QUAD 36+ mos IM  Essential hypertension -     losartan (COZAAR) 100 MG tablet; Take 1 tablet (100 mg total) by mouth daily. -     hydrochlorothiazide (HYDRODIURIL) 12.5 MG tablet; Take 1 tablet (12.5 mg total) by mouth daily.  Allergic rhinitis, unspecified allergic rhinitis type -     cetirizine-pseudoephedrine (ZYRTEC-D) 5-120 MG tablet; Take 1 tablet by mouth 2 (two) times daily.  Hypothyroidism, unspecified hypothyroidism type -     TSH -     levothyroxine (SYNTHROID, LEVOTHROID) 75 MCG tablet; Take 1 tablet (75 mcg total) by mouth daily before breakfast.   Meds ordered this encounter  Medications  . losartan (COZAAR) 100 MG tablet    Sig: Take 1 tablet (100 mg total) by mouth daily.    Dispense:  90 tablet    Refill:  3  . cetirizine-pseudoephedrine (ZYRTEC-D) 5-120 MG tablet    Sig: Take 1 tablet by mouth 2 (two) times daily.    Dispense:  60 tablet    Refill:  1  . hydrochlorothiazide (HYDRODIURIL) 12.5 MG tablet    Sig: Take 1 tablet (12.5 mg total) by mouth daily.    Dispense:  90 tablet    Refill:  3    90 day supply requested  . levothyroxine (SYNTHROID, LEVOTHROID) 75 MCG tablet    Sig: Take 1 tablet (75 mcg total) by mouth daily before breakfast.    Dispense:  90 tablet    Refill:  1    Follow-up: No Follow-up on file.   Boykin Nearing MD

## 2015-09-13 NOTE — Progress Notes (Signed)
ASSESSMENT: Pt currently experiencing Major depressive disorder, recurrent episode, moderate. Pt needs to f/u with psychiatry, PCP and [facility]; would benefit from psychoeducation and brief Solution-Focused therapy regarding coping with current symptoms of depression.  Stage of Change: contemplative  PLAN: 1. F/U with behavioral health consultant in two weeks 2. Psychiatric Medications: Wellbutrin, Prozac. 3. Behavioral recommendation(s):   -Continue attending [facility] appointments with psychiatry -Continue attending faith-based meetings twice weekly -Contact Medicaid transportation to set up rides for medical appointments -Consider reading educational material regarding coping with symptoms of depression  SUBJECTIVE: Pt. referred by Dr Adrian Blackwater for symptoms depression:  Pt. reports the following symptoms/concerns: Pt states that her primary concern is not having transportation to and from her psychiatry appointments, which increases her anxiety; she copes by taking her medication and going to [employer] twice/week. Duration of problem: "many years" Severity: severe  OBJECTIVE: Orientation & Cognition: Oriented x3. Thought processes normal and appropriate to situation. Mood: appropriate. Affect: appropriate Appearance: appropriate Risk of harm to self or others: no known risk of harm to self or others (denies any SI thoughts) Substance use: no Assessments administered: PHQ9: 21/ GAD7: 8  Diagnosis: Major depressive disorder, recurrent episode, moderate CPT Code: F33.1 -------------------------------------------- Other(s) present in the room: none  Time spent with patient in exam room: 20 minutes, 2:35-2:55pm   Depression screen [facility] 2/9 09/13/2015 03/27/2015 10/26/2014 10/26/2014  Decreased Interest 3 3 3 3   Down, Depressed, Hopeless 3 3 3 3   PHQ - 2 Score 6 6 6 6   Altered sleeping 3 3 3 3   Tired, decreased energy 3 3 3 3   Change in appetite 2 3 3 3   Feeling bad or failure  about yourself  2 2 2 2   Trouble concentrating 2 3 2 2   Moving slowly or fidgety/restless 2 2 3 3   Suicidal thoughts 1 1 0 0  PHQ-9 Score 21 23 22 22    GAD 7 : Generalized Anxiety Score 09/13/2015 09/13/2015 09/13/2015  Nervous, Anxious, on Edge 2 0 3  Control/stop worrying 2 0 3  Worry too much - different things 2 0 3  Trouble relaxing 1 - 0  Restless 0 - 0  Easily annoyed or irritable 0 - 0  Afraid - awful might happen 1 - 0  Total GAD 7 Score 8 - 9

## 2015-09-13 NOTE — Progress Notes (Signed)
F/U HTN DM C/C low energy, no motivation tired  No pain today  No tobacco user  No suicidal thought in the past two weeks

## 2015-09-13 NOTE — Patient Instructions (Addendum)
Linda George was seen today for hypertension and diabetes.  Diagnoses and all orders for this visit:  Diabetes mellitus without complication ([place]) -     POCT glycosylated hemoglobin (Hb A1C) -     POCT glucose (manual entry) -     Flu Vaccine QUAD 36+ mos IM  Essential hypertension -     losartan (COZAAR) 100 MG tablet; Take 1 tablet (100 mg total) by mouth daily. -     hydrochlorothiazide (HYDRODIURIL) 12.5 MG tablet; Take 1 tablet (12.5 mg total) by mouth daily.  Allergic rhinitis, unspecified allergic rhinitis type -     cetirizine-pseudoephedrine (ZYRTEC-D) 5-120 MG tablet; Take 1 tablet by mouth 2 (two) times daily.  Hypothyroidism, unspecified hypothyroidism type -     TSH   Orthostatic vital signs normal   Stop at front office to inquire about transportation needs to medical appointments   F/u in 3 weeks with pharmacist for HTN  F.u with me in 3 months for HTN, diabetes and hypothyroidism   Dr. Adrian Blackwater

## 2015-09-14 MED ORDER — LEVOTHYROXINE SODIUM 75 MCG PO TABS: 75.0000 ug | ORAL_TABLET | Freq: Every day | ORAL | Status: DC

## 2015-09-14 NOTE — Assessment & Plan Note (Signed)
A: HTN BP above goal Med: compliant P: Increase losartan to 100 mg daily Add HCTZ 12.5 mg daily

## 2015-09-14 NOTE — Assessment & Plan Note (Signed)
A; hypothyroidism Med: compliant TSH is still elevated but improved  P: Increase synthroid to 75 mcg daily

## 2015-09-17 ENCOUNTER — Telehealth: Payer: Self-pay

## 2015-09-17 NOTE — Telephone Encounter (Signed)
-----   Message from Boykin Nearing, MD sent at 09/14/2015 12:35 PM EST ----- TSH improved but still elevated, increase synthroid dose to 75 mcg daily

## 2015-09-17 NOTE — Telephone Encounter (Signed)
CMA called patient, patient verified name and DOB. Patient was given lab results and had a question about current meds on hand. Patient wanted to know if she needed to dispose of her current 53mcg tablets. I explained to the patient that seeing how she's on 51mcg she can cut a tablet in half to give her 1.5 tab=61mcg. That way she could use what she had at home. Patient verbalized she understood with no further questions.

## 2015-09-23 MED FILL — LEVOTHYROXINE 75 MCG TABLET: 75 | 30 days supply | Qty: 30 | Fill #0

## 2015-10-08 ENCOUNTER — Encounter: Payer: Medicaid Other | Admitting: Pharmacist

## 2015-10-21 MED FILL — ALL DAY ALLERGY 10 MG TAB: 10 | 30 days supply | Qty: 30 | Fill #3

## 2015-10-21 MED FILL — LEVOTHYROXINE 75 MCG TABLET: 75 | 30 days supply | Qty: 30 | Fill #1

## 2015-11-25 MED FILL — ALL DAY ALLERGY 10 MG TAB: 10 | 30 days supply | Qty: 30 | Fill #4

## 2015-11-26 MED FILL — LEVOTHYROXINE 75 MCG TABLET: 75 | 30 days supply | Qty: 30 | Fill #2

## 2015-12-31 MED FILL — LEVOTHYROXINE 75 MCG TABLET: 75 | 90 days supply | Qty: 90 | Fill #3

## 2015-12-31 MED FILL — HYDROCHLOROTHIAZIDE 12.5 MG: 12.5 | 90 days supply | Qty: 90 | Fill #1

## 2015-12-31 MED FILL — LOSARTAN POTASSIUM 100 MG T: 100 | 90 days supply | Qty: 90 | Fill #1

## 2015-12-31 MED FILL — ALL DAY ALLERGY 10 MG TAB: 10 | 30 days supply | Qty: 30 | Fill #5

## 2016-01-03 ENCOUNTER — Ambulatory Visit: Payer: Medicaid Other | Attending: Family Medicine | Admitting: Family Medicine

## 2016-01-03 ENCOUNTER — Encounter: Payer: Self-pay | Admitting: Family Medicine

## 2016-01-03 VITALS — BP 135/84 | HR 61 | Temp 98.5°F | Resp 16 | Ht 64.5 in | Wt 142.0 lb

## 2016-01-03 DIAGNOSIS — E039 Hypothyroidism, unspecified: Secondary | ICD-10-CM | POA: Insufficient documentation

## 2016-01-03 DIAGNOSIS — Z7984 Long term (current) use of oral hypoglycemic drugs: Secondary | ICD-10-CM | POA: Insufficient documentation

## 2016-01-03 DIAGNOSIS — R5382 Chronic fatigue, unspecified: Secondary | ICD-10-CM

## 2016-01-03 DIAGNOSIS — E119 Type 2 diabetes mellitus without complications: Secondary | ICD-10-CM | POA: Insufficient documentation

## 2016-01-03 DIAGNOSIS — R5383 Other fatigue: Secondary | ICD-10-CM | POA: Insufficient documentation

## 2016-01-03 DIAGNOSIS — H8112 Benign paroxysmal vertigo, left ear: Secondary | ICD-10-CM

## 2016-01-03 DIAGNOSIS — Z7951 Long term (current) use of inhaled steroids: Secondary | ICD-10-CM | POA: Insufficient documentation

## 2016-01-03 DIAGNOSIS — R591 Generalized enlarged lymph nodes: Secondary | ICD-10-CM | POA: Insufficient documentation

## 2016-01-03 DIAGNOSIS — Z79899 Other long term (current) drug therapy: Secondary | ICD-10-CM | POA: Diagnosis not present

## 2016-01-03 DIAGNOSIS — H8110 Benign paroxysmal vertigo, unspecified ear: Secondary | ICD-10-CM | POA: Diagnosis not present

## 2016-01-03 LAB — IRON AND TIBC
%SAT: 16 % (ref 11–50)
IRON: 68 ug/dL (ref 45–160)
TIBC: 420 ug/dL (ref 250–450)
UIBC: 352 ug/dL (ref 125–400)

## 2016-01-03 LAB — FERRITIN: FERRITIN: 12 ng/mL (ref 10–232)

## 2016-01-03 LAB — GLUCOSE, POCT (MANUAL RESULT ENTRY): POC Glucose: 97 mg/dl (ref 70–99)

## 2016-01-03 LAB — POCT GLYCOSYLATED HEMOGLOBIN (HGB A1C): HEMOGLOBIN A1C: 5.7

## 2016-01-03 LAB — TSH: TSH: 1.29 mIU/L

## 2016-01-03 MED ORDER — MECLIZINE HCL 25 MG PO TABS: 25.0000 mg | ORAL_TABLET | Freq: Three times a day (TID) | ORAL | Status: DC | PRN

## 2016-01-03 MED FILL — TRAVEL SICKNESS 25 MG TAB C: 25 | 10 days supply | Qty: 30 | Fill #0

## 2016-01-03 NOTE — Progress Notes (Signed)
F/U DM HTN  No HTN medication x 5 days  Medicine refills  Stated dizziness with head movement and HA after  No pain today  No suicidal thoughts in the past two weeks

## 2016-01-03 NOTE — Progress Notes (Signed)
Subjective:  Patient ID: Linda George, female    DOB: [birth year]  Age: 55 y.o. MRN: [id number]  CC: Diabetes and Hypothyroidism   HPI Afsheen Antony presents for   1. Diabetes: compliant with metformin. No GI upset.   2. Hypothyroidism: fatigue. Dizziness. Taking synthroid. No change in weight.  3. Dizziness: with turning head to L, bending head back or down. For may years. No head trauma. No ringing in ears. Has not had neuroimaging. Has not tried vestibular rehab or meclizine.  4. Bunions: b/l feet. Painful in tight shoes. Interested in podiatry referral for bunion surgery but could not afford the co-pay.   5. Requesting dental referral: does not have a dental home.   Social History  Substance Use Topics  . Smoking status: Never Smoker   . Smokeless tobacco: Never Used  . Alcohol Use: No    Outpatient Prescriptions Prior to Visit  Medication Sig Dispense Refill  . Blood Glucose Monitoring Suppl (TRUERESULT BLOOD GLUCOSE) W/DEVICE KIT 1 each by Does not apply route 3 (three) times daily before meals. 1 each 0  . buPROPion (WELLBUTRIN SR) 150 MG 12 hr tablet Take 150 mg by mouth 2 (two) times daily.    . cetirizine (ZYRTEC) 10 MG tablet Take 1 tablet (10 mg total) by mouth daily. 90 tablet 3  . cetirizine-pseudoephedrine (ZYRTEC-D) 5-120 MG tablet Take 1 tablet by mouth 2 (two) times daily. 60 tablet 1  . FLUoxetine (PROZAC) 40 MG capsule Take 1 capsule (40 mg total) by mouth daily. 90 capsule 2  . fluticasone (FLONASE) 50 MCG/ACT nasal spray Place 2 sprays into both nostrils daily. (Patient taking differently: Place 2 sprays into both nostrils daily as needed for allergies. ) 48 g 3  . glucose blood (TRUETEST TEST) test strip 1 each by Other route 3 (three) times daily. Use as instructed 300 each 3  . hydrochlorothiazide (HYDRODIURIL) 12.5 MG tablet Take 1 tablet (12.5 mg total) by mouth daily. 90 tablet 3  . levothyroxine (SYNTHROID, LEVOTHROID) 75 MCG tablet Take 1 tablet (75  mcg total) by mouth daily before breakfast. 90 tablet 1  . losartan (COZAAR) 100 MG tablet Take 1 tablet (100 mg total) by mouth daily. 90 tablet 3  . metFORMIN (GLUCOPHAGE) 1000 MG tablet Take 0.5 tablets (500 mg total) by mouth 2 (two) times daily with a meal. 90 tablet 3  . pregabalin (LYRICA) 50 MG capsule Take 1 capsule (50 mg total) by mouth 2 (two) times daily. (Patient not taking: Reported on 09/13/2015) 180 capsule 3  . TRUEPLUS LANCETS 28G MISC 1 each by Does not apply route 3 (three) times daily. 100 each 12   No facility-administered medications prior to visit.    ROS Review of Systems  Constitutional: Positive for fatigue. Negative for fever and chills.  Eyes: Negative for visual disturbance.  Respiratory: Negative for shortness of breath.   Cardiovascular: Negative for chest pain.  Gastrointestinal: Negative for abdominal pain and blood in stool.  Musculoskeletal: Negative for back pain and arthralgias.  Skin: Positive for rash.  Allergic/Immunologic: Negative for immunocompromised state.  Neurological: Positive for dizziness and light-headedness.  Hematological: Negative for adenopathy. Does not bruise/bleed easily.  Psychiatric/Behavioral: Negative for suicidal ideas and dysphoric mood.    Objective:  BP 135/84 mmHg  Pulse 61  Temp(Src) 98.5 F (36.9 C) (Oral)  Resp 16  Ht 5' 4.5" (1.638 m)  Wt 142 lb (64.411 kg)  BMI 24.01 kg/m2  SpO2 63%  BP/Weight 01/03/2016 09/13/2015 06/11/2015  Systolic BP 196 940 982  Diastolic BP 84 90 99  Wt. (Lbs) 142 140 151  BMI 24.01 23.67 25.53    Physical Exam  Constitutional: She is oriented to person, place, and time. She appears well-developed and well-nourished. No distress.  HENT:  Head: Normocephalic and atraumatic.  Thinning hair and scattered alopecia   Neck: No thyromegaly present.  Cardiovascular: Normal rate, regular rhythm, normal heart sounds and intact distal pulses.   Pulmonary/Chest: Effort normal and breath  sounds normal.  Musculoskeletal: She exhibits no edema.       Feet:  Lymphadenopathy:    She has no cervical adenopathy.  Neurological: She is alert and oriented to person, place, and time.  Skin: Skin is warm and dry. No rash noted.  Psychiatric: She has a normal mood and affect.    Lab Results  Component Value Date   HGBA1C 5.60 09/13/2015   Lab Results  Component Value Date   HGBA1C 5.60 09/13/2015   CBG 97 Assessment & Plan:   Cordia was seen today for diabetes and hypothyroidism.  Diagnoses and all orders for this visit:  Diabetes mellitus without complication ([place]) -     POCT glycosylated hemoglobin (Hb A1C) -     POCT glucose (manual entry)  Hypothyroidism, unspecified hypothyroidism type -     TSH  Chronic fatigue -     Iron and TIBC -     Ferritin  Benign paroxysmal positional vertigo, left -     meclizine (ANTIVERT) 25 MG tablet; Take 1 tablet (25 mg total) by mouth 3 (three) times daily as needed for dizziness.    No orders of the defined types were placed in this encounter.    Follow-up: No Follow-up on file.   Boykin Nearing MD

## 2016-01-03 NOTE — Patient Instructions (Addendum)
Linda George was seen today for diabetes and hypothyroidism.  Diagnoses and all orders for this visit:  Diabetes mellitus without complication ([place]) -     POCT glycosylated hemoglobin (Hb A1C) -     POCT glucose (manual entry)  Hypothyroidism, unspecified hypothyroidism type -     TSH  Chronic fatigue -     Iron and TIBC -     Ferritin  Benign paroxysmal positional vertigo, left -     meclizine (ANTIVERT) 25 MG tablet; Take 1 tablet (25 mg total) by mouth 3 (three) times daily as needed for dizziness.   Please try the vestibular rehab exercise at home   F/u in 8 weeks for vertigo   Dr. Adrian Blackwater

## 2016-02-10 MED FILL — FLUoxetine HCL 40 MG CAPS: 40 | 60 days supply | Qty: 60 | Fill #3

## 2016-02-10 MED FILL — ALL DAY ALLERGY 10 MG TAB: 10 | 30 days supply | Qty: 30 | Fill #6

## 2016-02-10 MED FILL — metFORMIN HCL 1000 MG TABS: 1000 | 30 days supply | Qty: 30 | Fill #1

## 2016-02-12 ENCOUNTER — Telehealth: Payer: Self-pay

## 2016-02-12 NOTE — Telephone Encounter (Signed)
Letter generated   Upon patient return call:  please advise patient.

## 2016-02-12 NOTE — Telephone Encounter (Signed)
-----   Message from Boykin Nearing, MD sent at 01/06/2016  1:00 PM EDT ----- Normal iron studies  Normal TSH Continue current care plan

## 2016-02-18 ENCOUNTER — Telehealth: Payer: Self-pay

## 2016-02-18 NOTE — Telephone Encounter (Signed)
Patient advised: Normal iron studies,Normal TSH, Continue current care plan

## 2016-04-17 ENCOUNTER — Other Ambulatory Visit: Payer: Self-pay | Admitting: Family Medicine

## 2016-04-17 DIAGNOSIS — E119 Type 2 diabetes mellitus without complications: Secondary | ICD-10-CM

## 2016-04-17 DIAGNOSIS — E039 Hypothyroidism, unspecified: Secondary | ICD-10-CM

## 2016-04-17 DIAGNOSIS — G44229 Chronic tension-type headache, not intractable: Secondary | ICD-10-CM

## 2016-04-17 MED FILL — ALL DAY ALLERGY 10 MG TAB: 10 | 30 days supply | Qty: 30 | Fill #0

## 2016-04-17 MED FILL — HYDROCHLOROTHIAZIDE 12.5 MG: 12.5 | 90 days supply | Qty: 90 | Fill #2

## 2016-04-17 MED FILL — LEVOTHYROXINE 75 MCG TABLET: 75 | 30 days supply | Qty: 30 | Fill #0

## 2016-04-17 MED FILL — LOSARTAN POTASSIUM 100 MG T: 100 | 90 days supply | Qty: 90 | Fill #2

## 2016-04-17 MED FILL — FLUoxetine HCL 40 MG CAPS: 40 | 30 days supply | Qty: 30 | Fill #0

## 2016-04-20 ENCOUNTER — Other Ambulatory Visit: Payer: Self-pay | Admitting: Family Medicine

## 2016-04-20 DIAGNOSIS — E119 Type 2 diabetes mellitus without complications: Secondary | ICD-10-CM

## 2016-04-21 ENCOUNTER — Encounter: Payer: Self-pay | Admitting: Family Medicine

## 2016-04-21 ENCOUNTER — Ambulatory Visit: Payer: Medicaid Other | Attending: Family Medicine | Admitting: Family Medicine

## 2016-04-21 VITALS — BP 138/86 | HR 66 | Temp 97.9°F | Wt 148.6 lb

## 2016-04-21 DIAGNOSIS — R4189 Other symptoms and signs involving cognitive functions and awareness: Secondary | ICD-10-CM

## 2016-04-21 DIAGNOSIS — G44229 Chronic tension-type headache, not intractable: Secondary | ICD-10-CM | POA: Diagnosis not present

## 2016-04-21 DIAGNOSIS — F4310 Post-traumatic stress disorder, unspecified: Secondary | ICD-10-CM

## 2016-04-21 DIAGNOSIS — E119 Type 2 diabetes mellitus without complications: Secondary | ICD-10-CM

## 2016-04-21 DIAGNOSIS — R413 Other amnesia: Secondary | ICD-10-CM

## 2016-04-21 DIAGNOSIS — Z23 Encounter for immunization: Secondary | ICD-10-CM

## 2016-04-21 DIAGNOSIS — Z1231 Encounter for screening mammogram for malignant neoplasm of breast: Secondary | ICD-10-CM

## 2016-04-21 LAB — POCT GLYCOSYLATED HEMOGLOBIN (HGB A1C): HEMOGLOBIN A1C: 6.2

## 2016-04-21 LAB — GLUCOSE, POCT (MANUAL RESULT ENTRY): POC Glucose: 66 mg/dl — AB (ref 70–99)

## 2016-04-21 MED ORDER — BUPROPION HCL ER (SR) 150 MG PO TB12: 150.0000 mg | ORAL_TABLET | Freq: Two times a day (BID) | ORAL | 5 refills | Status: DC

## 2016-04-21 MED ORDER — GABAPENTIN 100 MG PO CAPS: ORAL_CAPSULE | ORAL | 3 refills | Status: DC

## 2016-04-21 MED FILL — BUPROPION HCL SR 150 MG TAB: 150 | 30 days supply | Qty: 60 | Fill #0

## 2016-04-21 MED FILL — GABAPENTIN 100 MG CAPSULE: 100 | 37 days supply | Qty: 90 | Fill #0

## 2016-04-21 NOTE — Patient Instructions (Addendum)
Linda George was seen today for uri.  Diagnoses and all orders for this visit:  Diabetes mellitus without complication ([place]) -     POCT glucose (manual entry) -     POCT glycosylated hemoglobin (Hb A1C)  PTSD (post-traumatic stress disorder) -     buPROPion (WELLBUTRIN SR) 150 MG 12 hr tablet; Take 1 tablet (150 mg total) by mouth 2 (two) times daily.  Chronic tension-type headache, not intractable -     gabapentin (NEURONTIN) 100 MG capsule; 100 mg daily for 1 week, 100 mg twice daily for 1 week, 100 mg three times daily  your next pap is due in 04/2018  F/u in  6 weeks for headaches   Dr. Adrian Blackwater

## 2016-04-21 NOTE — Progress Notes (Signed)
Subjective:  Patient ID: Linda George, female    DOB: [birth year]  Age: 55 y.o. MRN: [id number]  CC: URI   HPI Linda George has depression, diabetes, hypothyroidism she presents for    1. Chronic cold symptoms: for past two months. Started with a sore throat. Gradually improving. Still having to clear throat every day. Non productive. Taking zyrtec-D everyday.   2. Poor memory: trouble with concentration and recalling new information. Memory problems for past 5 years. Attempting to learn french to help with memory. Was homeless from many years and has lived with her daughterfor past 2.5 years. She has completed 1.5 years of college. No history of substance of abuse. No history head trauma. Her mood and depression has improved since she has become more active in church.   3. Frequent headaches: occur often. B/l temples. Tension type headaches. Has headache several times a week. No associated nausea, emesis, weakness or blurry vision.   Social History  Substance Use Topics  . Smoking status: Never Smoker  . Smokeless tobacco: Never Used  . Alcohol use No    Outpatient Medications Prior to Visit  Medication Sig Dispense Refill  . ALL DAY ALLERGY 10 MG tablet TAKE 1 TABLET BY MOUTH DAILY 90 tablet 0  . Blood Glucose Monitoring Suppl (TRUERESULT BLOOD GLUCOSE) W/DEVICE KIT 1 each by Does not apply route 3 (three) times daily before meals. 1 each 0  . buPROPion (WELLBUTRIN SR) 150 MG 12 hr tablet Take 150 mg by mouth 2 (two) times daily.    . cetirizine-pseudoephedrine (ZYRTEC-D) 5-120 MG tablet Take 1 tablet by mouth 2 (two) times daily. 60 tablet 1  . FLUoxetine (PROZAC) 40 MG capsule TAKE ONE CAPSULE BY MOUTH DAILY 90 capsule 0  . fluticasone (FLONASE) 50 MCG/ACT nasal spray Place 2 sprays into both nostrils daily. (Patient taking differently: Place 2 sprays into both nostrils daily as needed for allergies. ) 48 g 3  . glucose blood (TRUETEST TEST) test strip 1 each by Other route 3  (three) times daily. Use as instructed 300 each 3  . hydrochlorothiazide (HYDRODIURIL) 12.5 MG tablet Take 1 tablet (12.5 mg total) by mouth daily. 90 tablet 3  . levothyroxine (SYNTHROID, LEVOTHROID) 75 MCG tablet TAKE 1 TABLET BY MOUTH DAILY BEFORE BREAKFAST. 90 tablet 0  . losartan (COZAAR) 100 MG tablet Take 1 tablet (100 mg total) by mouth daily. 90 tablet 3  . meclizine (ANTIVERT) 25 MG tablet Take 1 tablet (25 mg total) by mouth 3 (three) times daily as needed for dizziness. 30 tablet 0  . metFORMIN (GLUCOPHAGE) 1000 MG tablet TAKE 1/2 TABLET BY MOUTH 2 TIMES DAILY WITH A MEAL 90 tablet 0  . pregabalin (LYRICA) 50 MG capsule Take 1 capsule (50 mg total) by mouth 2 (two) times daily. 180 capsule 3  . TRUEPLUS LANCETS 28G MISC 1 each by Does not apply route 3 (three) times daily. 100 each 12   No facility-administered medications prior to visit.     ROS Review of Systems  Constitutional: Positive for fatigue. Negative for chills and fever.  Eyes: Negative for visual disturbance.  Respiratory: Negative for shortness of breath.   Cardiovascular: Negative for chest pain.  Gastrointestinal: Negative for abdominal pain and blood in stool.  Musculoskeletal: Negative for arthralgias and back pain.  Skin: Negative for rash.  Allergic/Immunologic: Negative for immunocompromised state.  Neurological: Positive for dizziness, light-headedness and headaches.  Hematological: Negative for adenopathy. Does not bruise/bleed easily.  Psychiatric/Behavioral: Negative for dysphoric mood  and suicidal ideas.    Objective:  BP 138/86   Pulse 66   Temp 97.9 F (36.6 C)   Wt 148 lb 9.6 oz (67.4 kg)   BMI 25.11 kg/m   BP/Weight 04/21/2016 01/03/2016 09/22/2540  Systolic BP 706 237 628  Diastolic BP 86 84 90  Wt. (Lbs) 148.6 142 140  BMI 25.11 24.01 23.67   Physical Exam  Constitutional: She is oriented to person, place, and time. She appears well-developed and well-nourished. No distress.  HENT:    Head: Normocephalic and atraumatic.  Cardiovascular: Normal rate, regular rhythm, normal heart sounds and intact distal pulses.   Pulmonary/Chest: Effort normal and breath sounds normal.  Musculoskeletal: She exhibits no edema.  Neurological: She is alert and oriented to person, place, and time.  Skin: Skin is warm and dry. No rash noted.  Psychiatric: She has a normal mood and affect.   MMSE - Mini Mental State Exam 04/21/2016  Orientation to time 5  Orientation to Place 5  Registration 3  Attention/ Calculation 2  Recall 3  Language- name 2 objects 2  Language- repeat 1  Language- follow 3 step command 3  Language- read & follow direction 1  Write a sentence 1  Copy design 1  Total score 27   Lab Results  Component Value Date   HGBA1C 5.7 01/03/2016   Lab Results  Component Value Date   HGBA1C 6.2 04/21/2016    CBG 66 Assessment & Plan:  Jadyn was seen today for uri.  Diagnoses and all orders for this visit:  Diabetes mellitus without complication ([place]) -     POCT glucose (manual entry) -     POCT glycosylated hemoglobin (Hb A1C)  PTSD (post-traumatic stress disorder) -     buPROPion (WELLBUTRIN SR) 150 MG 12 hr tablet; Take 1 tablet (150 mg total) by mouth 2 (two) times daily.  Chronic tension-type headache, not intractable -     gabapentin (NEURONTIN) 100 MG capsule; 100 mg daily for 1 week, 100 mg twice daily for 1 week, 100 mg three times daily  Encounter for immunization -     POCT glucose (manual entry) -     POCT glycosylated hemoglobin (Hb A1C) -     buPROPion (WELLBUTRIN SR) 150 MG 12 hr tablet; Take 1 tablet (150 mg total) by mouth 2 (two) times daily. -     gabapentin (NEURONTIN) 100 MG capsule; 100 mg daily for 1 week, 100 mg twice daily for 1 week, 100 mg three times daily -     Flu Vaccine QUAD 36+ mos IM  Visit for screening mammogram -     MM DIGITAL SCREENING BILATERAL; Future   There are no diagnoses linked to this encounter.  No orders  of the defined types were placed in this encounter.   Follow-up: No Follow-up on file.   Boykin Nearing MD

## 2016-04-22 ENCOUNTER — Other Ambulatory Visit: Payer: Self-pay | Admitting: Family Medicine

## 2016-04-22 DIAGNOSIS — E119 Type 2 diabetes mellitus without complications: Secondary | ICD-10-CM

## 2016-04-26 ENCOUNTER — Telehealth: Payer: Self-pay | Admitting: Family Medicine

## 2016-04-26 DIAGNOSIS — R4189 Other symptoms and signs involving cognitive functions and awareness: Secondary | ICD-10-CM | POA: Insufficient documentation

## 2016-04-26 NOTE — Assessment & Plan Note (Signed)
Subjective memory complaints with no significant cognitive impairments discovered on mental assessment  Plan: Treat depression

## 2016-04-26 NOTE — Telephone Encounter (Signed)
Please call patient I have ordered CT head to further evaluate her longstanding headaches  Please schedule and call patient with appt details.

## 2016-04-26 NOTE — Assessment & Plan Note (Signed)
A: ongoing tension type headache for one year P: Restart gabapentin CT head for further evaluation of chronic headache

## 2016-05-04 NOTE — Telephone Encounter (Signed)
Pt was called on 10/2 and a VM was left for pt informing her of her CT scan date on May 08, 2016 at 1:45.

## 2016-05-05 NOTE — Telephone Encounter (Signed)
Pt was called on 10/03 and informed of CT scan date and time.

## 2016-05-08 ENCOUNTER — Ambulatory Visit (HOSPITAL_COMMUNITY)
Admission: RE | Admit: 2016-05-08 | Discharge: 2016-05-08 | Disposition: A | Discharge status: Home / Self Care | Payer: Medicaid Other | Source: Ambulatory Visit | Attending: Family Medicine | Admitting: Family Medicine

## 2016-05-08 DIAGNOSIS — G44229 Chronic tension-type headache, not intractable: Secondary | ICD-10-CM | POA: Diagnosis present

## 2016-05-08 DIAGNOSIS — Z23 Encounter for immunization: Secondary | ICD-10-CM

## 2016-05-12 ENCOUNTER — Telehealth: Payer: Self-pay | Admitting: *Deleted

## 2016-05-12 NOTE — Telephone Encounter (Signed)
-----   Message from Boykin Nearing, MD sent at 05/11/2016 10:43 AM EDT ----- Normal CT of head obtained to evaluate headaches and dizziness

## 2016-05-12 NOTE — Telephone Encounter (Signed)
MA informed patient of CT being normal for the evaluation of HA's and Dizziness on voicemail. Patient was provided the [facility] contact number for any concerns or questions.

## 2016-05-12 NOTE — Telephone Encounter (Signed)
Medical Assistant left message on patient's home and cell voicemail. Voicemail states to give a call back to Henna Derderian with [facility] at [phone].  

## 2016-05-15 ENCOUNTER — Other Ambulatory Visit: Payer: Self-pay | Admitting: Family Medicine

## 2016-05-15 DIAGNOSIS — Z23 Encounter for immunization: Secondary | ICD-10-CM

## 2016-05-15 DIAGNOSIS — Z1231 Encounter for screening mammogram for malignant neoplasm of breast: Secondary | ICD-10-CM

## 2016-05-21 ENCOUNTER — Other Ambulatory Visit: Payer: Self-pay | Admitting: Family Medicine

## 2016-05-21 DIAGNOSIS — E119 Type 2 diabetes mellitus without complications: Secondary | ICD-10-CM

## 2016-05-21 MED ORDER — FLUOXETINE HCL 40 MG PO CAPS: 40.0000 mg | ORAL_CAPSULE | Freq: Every day | ORAL | 0 refills | Status: DC

## 2016-05-21 MED ORDER — LEVOTHYROXINE SODIUM 75 MCG PO TABS: ORAL_TABLET | ORAL | 0 refills | Status: DC

## 2016-05-21 MED FILL — LEVOTHYROXINE 75 MCG TABLET: 75 | 30 days supply | Qty: 30 | Fill #1

## 2016-05-21 MED FILL — metFORMIN HCL 1000 MG TABS: 1000 | 30 days supply | Qty: 30 | Fill #0

## 2016-05-21 MED FILL — ALL DAY ALLERGY 10 MG TAB: 10 | 30 days supply | Qty: 30 | Fill #1

## 2016-05-21 MED FILL — FLUoxetine HCL 40 MG CAPS: 40 | 30 days supply | Qty: 30 | Fill #1

## 2016-05-21 NOTE — Telephone Encounter (Signed)
Medication Refill:  metFORMIN (GLUCOPHAGE) 1000 MG tablet   FLUoxetine (PROZAC) 40 MG capsule  levothyroxine (SYNTHROID, LEVOTHROID) 75 MCG tablet   Pt called to schedule an appointment but PCP has no availability until November Pt was instructed to call next week when we will start scheduling November apoitnemnt

## 2016-05-28 ENCOUNTER — Ambulatory Visit
Admission: RE | Admit: 2016-05-28 | Discharge: 2016-05-28 | Disposition: A | Discharge status: Home / Self Care | Payer: Medicaid Other | Source: Ambulatory Visit | Attending: Family Medicine | Admitting: Family Medicine

## 2016-05-28 DIAGNOSIS — Z1231 Encounter for screening mammogram for malignant neoplasm of breast: Secondary | ICD-10-CM

## 2016-06-08 ENCOUNTER — Ambulatory Visit: Payer: Medicaid Other | Admitting: Family Medicine

## 2016-06-09 ENCOUNTER — Ambulatory Visit: Payer: Medicaid Other | Admitting: Family Medicine

## 2016-06-18 ENCOUNTER — Ambulatory Visit: Payer: Medicaid Other | Admitting: Family Medicine

## 2016-07-01 ENCOUNTER — Telehealth: Payer: Self-pay | Admitting: Family Medicine

## 2016-07-01 MED ORDER — CETIRIZINE-PSEUDOEPHEDRINE ER 5-120 MG PO TB12: 1.0000 | ORAL_TABLET | Freq: Two times a day (BID) | ORAL | 0 refills | Status: DC

## 2016-07-01 MED ORDER — LEVOTHYROXINE SODIUM 75 MCG PO TABS: ORAL_TABLET | ORAL | 0 refills | Status: DC

## 2016-07-01 MED ORDER — FLUOXETINE HCL 40 MG PO CAPS: 40.0000 mg | ORAL_CAPSULE | Freq: Every day | ORAL | 0 refills | Status: DC

## 2016-07-01 NOTE — Telephone Encounter (Signed)
Requested medications refilled 

## 2016-07-01 NOTE — Telephone Encounter (Signed)
Patient called to request medication refill for   FLUoxetine (PROZAC) 40 MG capsule. levothyroxine (SYNTHROID, LEVOTHROID) 75 MCG (three month supply for these two) and cetirizine-pseudoephedrine (ZYRTEC-D) 5-120 MG tablet. Please call it in to our pharmacy.  Thank you

## 2016-07-09 MED FILL — LEVOTHYROXINE 75 MCG TABLET: 75 | 30 days supply | Qty: 30 | Fill #0

## 2016-07-09 MED FILL — FLUoxetine HCL 40 MG CAPS: 40 | 30 days supply | Qty: 30 | Fill #0

## 2016-07-30 ENCOUNTER — Encounter: Payer: Self-pay | Admitting: Family Medicine

## 2016-07-30 ENCOUNTER — Ambulatory Visit: Payer: Medicaid Other | Attending: Family Medicine | Admitting: Family Medicine

## 2016-07-30 VITALS — BP 119/74 | HR 62 | Temp 97.8°F | Ht 64.5 in | Wt 158.0 lb

## 2016-07-30 DIAGNOSIS — R42 Dizziness and giddiness: Secondary | ICD-10-CM | POA: Diagnosis not present

## 2016-07-30 DIAGNOSIS — G44229 Chronic tension-type headache, not intractable: Secondary | ICD-10-CM

## 2016-07-30 DIAGNOSIS — J3089 Other allergic rhinitis: Secondary | ICD-10-CM

## 2016-07-30 DIAGNOSIS — F4310 Post-traumatic stress disorder, unspecified: Secondary | ICD-10-CM

## 2016-07-30 DIAGNOSIS — Z7984 Long term (current) use of oral hypoglycemic drugs: Secondary | ICD-10-CM | POA: Diagnosis not present

## 2016-07-30 DIAGNOSIS — Z79899 Other long term (current) drug therapy: Secondary | ICD-10-CM | POA: Diagnosis not present

## 2016-07-30 DIAGNOSIS — I10 Essential (primary) hypertension: Secondary | ICD-10-CM | POA: Diagnosis not present

## 2016-07-30 DIAGNOSIS — E119 Type 2 diabetes mellitus without complications: Secondary | ICD-10-CM | POA: Diagnosis not present

## 2016-07-30 DIAGNOSIS — R5383 Other fatigue: Secondary | ICD-10-CM | POA: Diagnosis not present

## 2016-07-30 DIAGNOSIS — E039 Hypothyroidism, unspecified: Secondary | ICD-10-CM | POA: Diagnosis not present

## 2016-07-30 DIAGNOSIS — Z23 Encounter for immunization: Secondary | ICD-10-CM | POA: Diagnosis not present

## 2016-07-30 DIAGNOSIS — Z7982 Long term (current) use of aspirin: Secondary | ICD-10-CM | POA: Insufficient documentation

## 2016-07-30 DIAGNOSIS — R5382 Chronic fatigue, unspecified: Secondary | ICD-10-CM | POA: Diagnosis not present

## 2016-07-30 DIAGNOSIS — H8110 Benign paroxysmal vertigo, unspecified ear: Secondary | ICD-10-CM | POA: Insufficient documentation

## 2016-07-30 DIAGNOSIS — L299 Pruritus, unspecified: Secondary | ICD-10-CM | POA: Insufficient documentation

## 2016-07-30 DIAGNOSIS — Z1159 Encounter for screening for other viral diseases: Secondary | ICD-10-CM

## 2016-07-30 DIAGNOSIS — H8112 Benign paroxysmal vertigo, left ear: Secondary | ICD-10-CM

## 2016-07-30 LAB — COMPLETE METABOLIC PANEL WITH GFR
ALBUMIN: 4.2 g/dL (ref 3.6–5.1)
ALK PHOS: 79 U/L (ref 33–130)
ALT: 59 U/L — ABNORMAL HIGH (ref 6–29)
AST: 47 U/L — AB (ref 10–35)
BUN: 19 mg/dL (ref 7–25)
CO2: 26 mmol/L (ref 20–31)
Calcium: 9.7 mg/dL (ref 8.6–10.4)
Chloride: 102 mmol/L (ref 98–110)
Creat: 1.03 mg/dL (ref 0.50–1.05)
GFR, Est African American: 70 mL/min (ref >=60)
GFR, Est Non African American: 60 mL/min (ref >=60)
GLUCOSE: 113 mg/dL — AB (ref 65–99)
POTASSIUM: 4.6 mmol/L (ref 3.5–5.3)
SODIUM: 137 mmol/L (ref 135–146)
Total Bilirubin: 0.6 mg/dL (ref 0.2–1.2)
Total Protein: 7.6 g/dL (ref 6.1–8.1)

## 2016-07-30 LAB — TSH: TSH: 3.18 mIU/L

## 2016-07-30 LAB — GLUCOSE, POCT (MANUAL RESULT ENTRY): POC Glucose: 126 mg/dl — AB (ref 70–99)

## 2016-07-30 MED ORDER — MECLIZINE HCL 25 MG PO TABS: 25.0000 mg | ORAL_TABLET | Freq: Three times a day (TID) | ORAL | 3 refills | Status: DC | PRN

## 2016-07-30 MED ORDER — METFORMIN HCL 1000 MG PO TABS: ORAL_TABLET | ORAL | 3 refills | Status: DC

## 2016-07-30 MED ORDER — BUPROPION HCL ER (SR) 150 MG PO TB12: 150.0000 mg | ORAL_TABLET | Freq: Two times a day (BID) | ORAL | 3 refills | Status: DC

## 2016-07-30 MED ORDER — LOSARTAN POTASSIUM 100 MG PO TABS: 100.0000 mg | ORAL_TABLET | Freq: Every day | ORAL | 3 refills | Status: DC

## 2016-07-30 MED ORDER — FLUOXETINE HCL 40 MG PO CAPS: 40.0000 mg | ORAL_CAPSULE | Freq: Every day | ORAL | 3 refills | Status: DC

## 2016-07-30 MED ORDER — GABAPENTIN 100 MG PO CAPS: 100.0000 mg | ORAL_CAPSULE | Freq: Two times a day (BID) | ORAL | 3 refills | Status: DC

## 2016-07-30 MED ORDER — CETIRIZINE HCL 10 MG PO TABS: 10.0000 mg | ORAL_TABLET | Freq: Every day | ORAL | 11 refills | Status: DC

## 2016-07-30 MED ORDER — LEVOTHYROXINE SODIUM 75 MCG PO TABS: ORAL_TABLET | ORAL | 3 refills | Status: DC

## 2016-07-30 MED ORDER — CYANOCOBALAMIN 1000 MCG/ML IJ SOLN
1000.0000 ug | Freq: Once | INTRAMUSCULAR | Status: AC
  Administered 2016-07-30: 1000 ug via INTRAMUSCULAR

## 2016-07-30 MED FILL — metFORMIN HCL 1000 MG TABS: 1000 | 90 days supply | Qty: 90 | Fill #0

## 2016-07-30 MED FILL — GABAPENTIN 100 MG CAPSULE: 100 | 30 days supply | Qty: 60 | Fill #0

## 2016-07-30 MED FILL — TRAVEL SICKNESS 25 MG TAB C: 25 | 30 days supply | Qty: 90 | Fill #0

## 2016-07-30 NOTE — Assessment & Plan Note (Signed)
This is chronic No red flags Vit B12 shot done today  Checking TSH

## 2016-07-30 NOTE — Patient Instructions (Addendum)
Linda George was seen today for diabetes, hypertension and pruritis.  Diagnoses and all orders for this visit:  Diabetes mellitus without complication ([place]) -     POCT glucose (manual entry) -     metFORMIN (GLUCOPHAGE) 1000 MG tablet; TAKE 1/2 TABLET BY MOUTH 2 TIMES DAILY WITH A MEAL  Essential hypertension -     COMPLETE METABOLIC PANEL WITH GFR -     losartan (COZAAR) 100 MG tablet; Take 1 tablet (100 mg total) by mouth daily.  Chronic tension-type headache, not intractable -     gabapentin (NEURONTIN) 100 MG capsule; Take 1 capsule (100 mg total) by mouth 2 (two) times daily.  Dizzy -     buPROPion (WELLBUTRIN SR) 150 MG 12 hr tablet; Take 1 tablet (150 mg total) by mouth 2 (two) times daily.  PTSD (post-traumatic stress disorder) -     FLUoxetine (PROZAC) 40 MG capsule; Take 1 capsule (40 mg total) by mouth daily. -     buPROPion (WELLBUTRIN SR) 150 MG 12 hr tablet; Take 1 tablet (150 mg total) by mouth 2 (two) times daily.  Benign paroxysmal positional vertigo, left -     meclizine (ANTIVERT) 25 MG tablet; Take 1 tablet (25 mg total) by mouth 3 (three) times daily as needed for dizziness.  Hypothyroidism, unspecified type -     TSH -     levothyroxine (SYNTHROID, LEVOTHROID) 75 MCG tablet; TAKE 1 TABLET BY MOUTH DAILY BEFORE BREAKFAST.  Need for hepatitis C screening test -     Hepatitis C antibody, reflex  Chronic non-seasonal allergic rhinitis, unspecified trigger -     cetirizine (ZYRTEC) 10 MG tablet; Take 1 tablet (10 mg total) by mouth daily.  Chronic fatigue -     cyanocobalamin ((VITAMIN B-12)) injection 1,000 mcg; Inject 1 mL (1,000 mcg total) into the muscle once.   You will be called with lab results   Dr. Adrian Blackwater

## 2016-07-30 NOTE — Assessment & Plan Note (Signed)
Well controlled Compliant Continue current regimen

## 2016-07-30 NOTE — Progress Notes (Signed)
Subjective:  Patient ID: Linda George, female    DOB: 1960-11-02  Age: 55 y.o. MRN: [id number]  CC: Diabetes; Hypertension; and Pruritis   HPI Maty Zeisler has PTSD, chronic tension headache, HTN,  DM2, hypothyroidism she  presents for    1. CHRONIC DIABETES  Disease Monitoring  Blood Sugar Ranges: not checking   Polyuria: no   Visual problems: no   Medication Compliance: yes  Medication Side Effects  Hypoglycemia: no   Preventitive Health Care  Eye Exam: due   Foot Exam: done recently   Diet pattern: eating high carbs   Exercise:    2. CHRONIC HYPERTENSION  Disease Monitoring  Blood pressure range: not checking   Chest pain: no   Dyspnea: no   Claudication: no   Medication compliance: yes  Medication Side Effects  Lightheadedness: no   Urinary frequency: no   Edema: no    3. Itching: back, arms and legs. Dry skin. No rash. Zyrtec helps. Request refill,.  4. Fatigue: this is chronic. Taking synthroid for hypothyroidism. No weight loss or chills. No night sweats.    Social History  Substance Use Topics  . Smoking status: Never Smoker  . Smokeless tobacco: Never Used  . Alcohol use No    Outpatient Medications Prior to Visit  Medication Sig Dispense Refill  . ALL DAY ALLERGY 10 MG tablet TAKE 1 TABLET BY MOUTH DAILY 90 tablet 0  . aspirin 81 MG tablet Take 1 tablet by mouth daily.    . Blood Glucose Monitoring Suppl (TRUERESULT BLOOD GLUCOSE) W/DEVICE KIT 1 each by Does not apply route 3 (three) times daily before meals. 1 each 0  . buPROPion (WELLBUTRIN SR) 150 MG 12 hr tablet Take 1 tablet (150 mg total) by mouth 2 (two) times daily. 60 tablet 5  . cetirizine-pseudoephedrine (ZYRTEC-D) 5-120 MG tablet Take 1 tablet by mouth 2 (two) times daily. 60 tablet 0  . FLUoxetine (PROZAC) 40 MG capsule Take 1 capsule (40 mg total) by mouth daily. 90 capsule 0  . fluticasone (FLONASE) 50 MCG/ACT nasal spray Place 2 sprays into both nostrils daily. (Patient  taking differently: Place 2 sprays into both nostrils daily as needed for allergies. ) 48 g 3  . gabapentin (NEURONTIN) 100 MG capsule 100 mg daily for 1 week, 100 mg twice daily for 1 week, 100 mg three times daily 90 capsule 3  . glucose blood (TRUETEST TEST) test strip 1 each by Other route 3 (three) times daily. Use as instructed 300 each 3  . hydrochlorothiazide (HYDRODIURIL) 12.5 MG tablet Take 1 tablet (12.5 mg total) by mouth daily. 90 tablet 3  . levothyroxine (SYNTHROID, LEVOTHROID) 75 MCG tablet TAKE 1 TABLET BY MOUTH DAILY BEFORE BREAKFAST. 90 tablet 0  . losartan (COZAAR) 100 MG tablet Take 1 tablet (100 mg total) by mouth daily. 90 tablet 3  . meclizine (ANTIVERT) 25 MG tablet Take 1 tablet (25 mg total) by mouth 3 (three) times daily as needed for dizziness. 30 tablet 0  . metFORMIN (GLUCOPHAGE) 1000 MG tablet TAKE 1/2 TABLET BY MOUTH 2 TIMES DAILY WITH A MEAL 90 tablet 0  . metFORMIN (GLUCOPHAGE) 1000 MG tablet TAKE 1/2 TABLET BY MOUTH 2 TIMES DAILY WITH A MEAL 90 tablet 3  . TRUEPLUS LANCETS 28G MISC 1 each by Does not apply route 3 (three) times daily. 100 each 12   No facility-administered medications prior to visit.     ROS Review of Systems  Constitutional: Positive for fatigue. Negative  for chills and fever.  Eyes: Negative for visual disturbance.  Respiratory: Negative for shortness of breath.   Cardiovascular: Negative for chest pain.  Gastrointestinal: Negative for abdominal pain and blood in stool.  Musculoskeletal: Negative for arthralgias and back pain.  Skin: Negative for rash.       Itching   Allergic/Immunologic: Negative for immunocompromised state.  Neurological: Positive for dizziness, light-headedness and headaches.  Hematological: Negative for adenopathy. Does not bruise/bleed easily.  Psychiatric/Behavioral: Negative for dysphoric mood and suicidal ideas.    Objective:  BP 119/74 (BP Location: Left Arm, Patient Position: Sitting, Cuff Size: Small)    Pulse 62   Temp 97.8 F (36.6 C) (Oral)   Ht 5' 4.5" (1.638 m)   Wt 158 lb (71.7 kg)   SpO2 98%   BMI 26.70 kg/m   BP/Weight 07/30/2016 11/03/4740 12/09/5636  Systolic BP 756 433 295  Diastolic BP 74 86 84  Wt. (Lbs) 158 148.6 142  BMI 26.7 25.11 24.01    Physical Exam  Constitutional: She is oriented to person, place, and time. She appears well-developed and well-nourished. No distress.  HENT:  Head: Normocephalic and atraumatic.  Cardiovascular: Normal rate, regular rhythm, normal heart sounds and intact distal pulses.   Pulmonary/Chest: Effort normal and breath sounds normal.  Musculoskeletal: She exhibits no edema.  Neurological: She is alert and oriented to person, place, and time.  Skin: Skin is warm and dry. No rash noted.  Psychiatric: She has a normal mood and affect.    Lab Results  Component Value Date   HGBA1C 6.2 04/21/2016   CBG 126  Assessment & Plan:   Fenna was seen today for diabetes, hypertension and pruritis.  Diagnoses and all orders for this visit:  Diabetes mellitus without complication ([place]) -     POCT glucose (manual entry) -     metFORMIN (GLUCOPHAGE) 1000 MG tablet; TAKE 1/2 TABLET BY MOUTH 2 TIMES DAILY WITH A MEAL -     Ambulatory referral to Ophthalmology  Essential hypertension -     COMPLETE METABOLIC PANEL WITH GFR -     losartan (COZAAR) 100 MG tablet; Take 1 tablet (100 mg total) by mouth daily.  Chronic tension-type headache, not intractable -     gabapentin (NEURONTIN) 100 MG capsule; Take 1 capsule (100 mg total) by mouth 2 (two) times daily.  Dizzy -     buPROPion (WELLBUTRIN SR) 150 MG 12 hr tablet; Take 1 tablet (150 mg total) by mouth 2 (two) times daily.  PTSD (post-traumatic stress disorder) -     FLUoxetine (PROZAC) 40 MG capsule; Take 1 capsule (40 mg total) by mouth daily. -     buPROPion (WELLBUTRIN SR) 150 MG 12 hr tablet; Take 1 tablet (150 mg total) by mouth 2 (two) times daily.  Benign paroxysmal positional  vertigo, left -     meclizine (ANTIVERT) 25 MG tablet; Take 1 tablet (25 mg total) by mouth 3 (three) times daily as needed for dizziness.  Hypothyroidism, unspecified type -     TSH -     levothyroxine (SYNTHROID, LEVOTHROID) 75 MCG tablet; TAKE 1 TABLET BY MOUTH DAILY BEFORE BREAKFAST.  Need for hepatitis C screening test -     Hepatitis C antibody, reflex  Chronic non-seasonal allergic rhinitis, unspecified trigger -     cetirizine (ZYRTEC) 10 MG tablet; Take 1 tablet (10 mg total) by mouth daily.  Chronic fatigue -     cyanocobalamin ((VITAMIN B-12)) injection 1,000 mcg; Inject 1 mL (  1,000 mcg total) into the muscle once.  Other orders -     Tdap vaccine greater than or equal to 7yo IM    No orders of the defined types were placed in this encounter.   Follow-up: Return in about 3 months (around 10/28/2016) for HTN and diabetes .   Boykin Nearing MD

## 2016-07-30 NOTE — Progress Notes (Signed)
Pt is here for skin itching, pt states that taking zyrtec. Pt also states that she needs refills on all medications.

## 2016-07-30 NOTE — Assessment & Plan Note (Signed)
Well controlled Continue metformin Patient referred to opthalmology

## 2016-07-31 ENCOUNTER — Telehealth: Payer: Self-pay

## 2016-07-31 LAB — HEPATITIS C ANTIBODY: HCV Ab: NEGATIVE

## 2016-07-31 NOTE — Telephone Encounter (Signed)
Pt was called and informed of lab results. 

## 2016-08-04 MED FILL — BUPROPION SR 150 MG TABLET: 150 | 30 days supply | Qty: 60 | Fill #0

## 2016-08-04 MED FILL — LOSARTAN POTASSIUM 100 MG T: 100 | 30 days supply | Qty: 30 | Fill #0

## 2016-08-04 MED FILL — FLUoxetine HCL 40 MG CAPS: 40 | 90 days supply | Qty: 90 | Fill #0

## 2016-08-04 MED FILL — LEVOTHYROXINE 75 MCG TABLET: 75 | 90 days supply | Qty: 90 | Fill #0

## 2016-08-05 MED FILL — ALL DAY ALLERGY 10 MG TAB: 10 | 30 days supply | Qty: 30 | Fill #2

## 2016-09-10 ENCOUNTER — Other Ambulatory Visit: Payer: Self-pay | Admitting: Family Medicine

## 2016-09-10 DIAGNOSIS — G44229 Chronic tension-type headache, not intractable: Secondary | ICD-10-CM

## 2016-09-10 MED FILL — TRAVEL SICKNESS 25 MG TAB C: 25 | 10 days supply | Qty: 30 | Fill #1

## 2016-09-10 MED FILL — GABAPENTIN 100 MG CAPSULE: 100 | 30 days supply | Qty: 60 | Fill #1

## 2016-09-10 MED FILL — LOSARTAN POTASSIUM 100 MG T: 100 | 30 days supply | Qty: 30 | Fill #1

## 2016-10-05 ENCOUNTER — Ambulatory Visit: Payer: Medicaid Other | Attending: Family Medicine | Admitting: Family Medicine

## 2016-10-05 ENCOUNTER — Encounter: Payer: Self-pay | Admitting: Family Medicine

## 2016-10-05 VITALS — BP 149/90 | HR 64 | Temp 98.2°F | Ht 64.5 in | Wt 157.8 lb

## 2016-10-05 DIAGNOSIS — Z7982 Long term (current) use of aspirin: Secondary | ICD-10-CM | POA: Insufficient documentation

## 2016-10-05 DIAGNOSIS — I10 Essential (primary) hypertension: Secondary | ICD-10-CM | POA: Diagnosis not present

## 2016-10-05 DIAGNOSIS — E119 Type 2 diabetes mellitus without complications: Secondary | ICD-10-CM | POA: Diagnosis not present

## 2016-10-05 DIAGNOSIS — J3089 Other allergic rhinitis: Secondary | ICD-10-CM | POA: Diagnosis not present

## 2016-10-05 DIAGNOSIS — R42 Dizziness and giddiness: Secondary | ICD-10-CM | POA: Insufficient documentation

## 2016-10-05 DIAGNOSIS — E039 Hypothyroidism, unspecified: Secondary | ICD-10-CM | POA: Diagnosis not present

## 2016-10-05 DIAGNOSIS — Z79899 Other long term (current) drug therapy: Secondary | ICD-10-CM | POA: Diagnosis not present

## 2016-10-05 DIAGNOSIS — F4310 Post-traumatic stress disorder, unspecified: Secondary | ICD-10-CM | POA: Diagnosis not present

## 2016-10-05 DIAGNOSIS — Z7984 Long term (current) use of oral hypoglycemic drugs: Secondary | ICD-10-CM | POA: Insufficient documentation

## 2016-10-05 LAB — GLUCOSE, POCT (MANUAL RESULT ENTRY): POC Glucose: 124 mg/dl — AB (ref 70–99)

## 2016-10-05 LAB — POCT GLYCOSYLATED HEMOGLOBIN (HGB A1C): HEMOGLOBIN A1C: 6.1

## 2016-10-05 MED ORDER — LOSARTAN POTASSIUM 100 MG PO TABS: 100.0000 mg | ORAL_TABLET | Freq: Every day | ORAL | 3 refills | Status: DC

## 2016-10-05 MED ORDER — BUPROPION HCL ER (SR) 150 MG PO TB12: 150.0000 mg | ORAL_TABLET | Freq: Two times a day (BID) | ORAL | 0 refills | Status: DC

## 2016-10-05 MED ORDER — CETIRIZINE HCL 10 MG PO TABS: 10.0000 mg | ORAL_TABLET | Freq: Every day | ORAL | 11 refills | Status: DC

## 2016-10-05 MED ORDER — HYDROCHLOROTHIAZIDE 12.5 MG PO TABS: 12.5000 mg | ORAL_TABLET | Freq: Every day | ORAL | 3 refills | Status: DC

## 2016-10-05 MED ORDER — CETIRIZINE HCL 10 MG PO TABS: 10.0000 mg | ORAL_TABLET | Freq: Every day | ORAL | 3 refills | Status: DC

## 2016-10-05 MED FILL — HYDROCHLOROTHIAZIDE 12.5 MG: 12.5 | 90 days supply | Qty: 90 | Fill #0

## 2016-10-05 MED FILL — LOSARTAN POTASSIUM 100 MG T: 100 | 90 days supply | Qty: 90 | Fill #0

## 2016-10-05 MED FILL — ALL DAY ALLERGY 10 MG TAB: 10 | 30 days supply | Qty: 30 | Fill #0

## 2016-10-05 NOTE — Assessment & Plan Note (Signed)
Restart zyrtec

## 2016-10-05 NOTE — Progress Notes (Signed)
Pt is here today for allergies.

## 2016-10-05 NOTE — Patient Instructions (Addendum)
Linda George was seen today for allergies.  Diagnoses and all orders for this visit:  Diabetes mellitus without complication ([facility]) -     HgB A1c -     Glucose (CBG)  Chronic non-seasonal allergic rhinitis, unspecified trigger -     Discontinue: cetirizine (ZYRTEC) 10 MG tablet; Take 1 tablet (10 mg total) by mouth daily. -     cetirizine (ZYRTEC) 10 MG tablet; Take 1 tablet (10 mg total) by mouth daily.  PTSD (post-traumatic stress disorder) -     buPROPion (WELLBUTRIN SR) 150 MG 12 hr tablet; Take 1 tablet (150 mg total) by mouth 2 (two) times daily.  Dizzy -     buPROPion (WELLBUTRIN SR) 150 MG 12 hr tablet; Take 1 tablet (150 mg total) by mouth 2 (two) times daily.  Essential hypertension -     hydrochlorothiazide (HYDRODIURIL) 12.5 MG tablet; Take 1 tablet (12.5 mg total) by mouth daily. -     losartan (COZAAR) 100 MG tablet; Take 1 tablet (100 mg total) by mouth daily.   Food Allergy A food allergy is an abnormal reaction to a food (food allergen) by the body's defense system (immune system). Foods that commonly cause allergies are:  Milk.  Seafood.  Eggs.  Nuts.  Wheat.  Soy. What are the causes? Food allergies happen when the immune system mistakenly sees a food as harmful and releases antibodies to fight it. What are the signs or symptoms? Symptoms may be mild or severe. They usually start minutes after the food is eaten, but they can occur even a few hours later. In people with a severe allergy, symptoms can start within seconds. Mild symptoms   Nasal congestion.  Tingling in the mouth.  An itchy, red rash.  Vomiting.  Diarrhea. Severe symptoms   Swelling of the lips, face, and tongue.  Swelling of the back of the mouth and throat.  Wheezing.  A hoarse voice.  Itchy, red, swollen areas of skin (hives).  Dizziness or light-headedness.  Fainting.  Trouble breathing, speaking, or swallowing.  Chest tightness.  Rapid heartbeat. How is this  diagnosed? A diagnosis is made with a physical exam, medical and family history, and one or more of the following:  Skin tests.  Blood tests.  A food diary.  The results of an elimination diet. The elimination diet involves removing foods from your diet and then adding them back in, one at a time. How is this treated? There is no cure for allergies. An allergic reaction can be treated with medicines, such as:  Antihistamines.  Steroids.  Respiratory inhalers.  Epinephrine. Severe symptoms can be a sign of a life-threatening reaction called anaphylaxis, and they require immediate treatment. Severe reactions usually need to be treated at a hospital. People who have had a severe reaction may be prescribed rescue medicines to take if they are accidentally exposed to an allergen. Follow these instructions at home: General instructions   Avoid the foods that you are allergic to.  Read food labels before you eat packaged items. Look for ingredients you are allergic to.  When you are at a restaurant, tell your server that you have an allergy. If you are unsure of whether a meal has an ingredient that you are allergic to, ask your server.  Take medicines only as directed by your health care provider. Do not drive until the medicine has worn off, unless your health care provider gives you approval.  Inform all health care providers that you have a food  allergy.  Contact your health care provider if you want to be tested for an allergy. If you have had an anaphylactic reaction before, you should never test yourself for an allergy without your health care provider's approval. Instructions for People with Severe Allergies   Wear a medical alert bracelet or necklace that describes your allergy.  Carry your anaphylaxis kit or an epinephrine injection with you at all times. Use them as directed by your health care provider.  Make sure that you, your family members, and your employer  know:  How to use an anaphylaxis kit.  How to give an epinephrine injection.  Replace your epinephrine immediately after use, in case you have another reaction.  Seek medical care even after you take epinephrine. This is important because epinephrine can be followed by a delayed, life-threatening reaction. Instructions for People with a Potential Allergy   Follow the elimination diet as directed by your health care provider.  Keep a food diary as directed by your health care provider. Every day, write down:  What you eat and drink and when.  What symptoms you have and when. Contact a health care provider if:  Your symptoms have not gone away within 2 days.  Your symptoms get worse.  You develop new symptoms. Get help right away if:  You use epinephrine.  You are having a severe allergic reaction. Symptoms of a severe reaction include:  Swelling of the lips, face, and tongue.  Swelling of the back of the mouth and throat.  Wheezing.  A hoarse voice.  Hives.  Dizziness or light-headedness.  Fainting.  Trouble breathing, speaking, or swallowing.  Chest tightness.  Rapid heartbeat. This information is not intended to replace advice given to you by your health care provider. Make sure you discuss any questions you have with your health care provider. Document Released: 07/17/2000 Document Revised: 12/17/2015 Document Reviewed: 05/01/2014 Elsevier Interactive Patient Education  2017 [employer].

## 2016-10-05 NOTE — Assessment & Plan Note (Signed)
Well controlled. Continue metformin.

## 2016-10-05 NOTE — Progress Notes (Signed)
Subjective:  Patient ID: Linda George, female    DOB: 1961/04/30  Age: 56 y.o. MRN: [id number]  CC: Allergies   HPI Linda George has PTSD, chronic tension headache, HTN,  DM2, hypothyroidism she  presents for    1. CHRONIC DIABETES  Disease Monitoring  Blood Sugar Ranges: not checking   Polyuria: no   Visual problems: no   Medication Compliance: yes  Medication Side Effects  Hypoglycemia: no   Preventitive Health Care  Eye Exam: due   Foot Exam: done recently   Diet pattern: eating high carbs   Exercise:    2. CHRONIC HYPERTENSION  Disease Monitoring  Blood pressure range: not checking   Chest pain: no   Dyspnea: no   Claudication: no   Medication compliance: taking losartan, has ran out of HCTZ  Medication Side Effects  Lightheadedness: no   Urinary frequency: no   Edema: no    3. Itching: back, arms and legs. Dry skin. No rash. Also have itching around arm and back. She also has itching around eyes.   Social History  Substance Use Topics  . Smoking status: Never Smoker  . Smokeless tobacco: Never Used  . Alcohol use No    Outpatient Medications Prior to Visit  Medication Sig Dispense Refill  . aspirin 81 MG tablet Take 1 tablet by mouth daily.    . Blood Glucose Monitoring Suppl (TRUERESULT BLOOD GLUCOSE) W/DEVICE KIT 1 each by Does not apply route 3 (three) times daily before meals. 1 each 0  . buPROPion (WELLBUTRIN SR) 150 MG 12 hr tablet Take 1 tablet (150 mg total) by mouth 2 (two) times daily. 180 tablet 3  . cetirizine (ZYRTEC) 10 MG tablet Take 1 tablet (10 mg total) by mouth daily. 30 tablet 11  . FLUoxetine (PROZAC) 40 MG capsule Take 1 capsule (40 mg total) by mouth daily. 90 capsule 3  . fluticasone (FLONASE) 50 MCG/ACT nasal spray Place 2 sprays into both nostrils daily. (Patient taking differently: Place 2 sprays into both nostrils daily as needed for allergies. ) 48 g 3  . gabapentin (NEURONTIN) 100 MG capsule Take 1 capsule (100 mg  total) by mouth 2 (two) times daily. 180 capsule 3  . glucose blood (TRUETEST TEST) test strip 1 each by Other route 3 (three) times daily. Use as instructed 300 each 3  . hydrochlorothiazide (HYDRODIURIL) 12.5 MG tablet Take 1 tablet (12.5 mg total) by mouth daily. 90 tablet 3  . levothyroxine (SYNTHROID, LEVOTHROID) 75 MCG tablet TAKE 1 TABLET BY MOUTH DAILY BEFORE BREAKFAST. 90 tablet 3  . losartan (COZAAR) 100 MG tablet Take 1 tablet (100 mg total) by mouth daily. 90 tablet 3  . meclizine (ANTIVERT) 25 MG tablet Take 1 tablet (25 mg total) by mouth 3 (three) times daily as needed for dizziness. 30 tablet 3  . metFORMIN (GLUCOPHAGE) 1000 MG tablet TAKE 1/2 TABLET BY MOUTH 2 TIMES DAILY WITH A MEAL 90 tablet 3  . TRUEPLUS LANCETS 28G MISC 1 each by Does not apply route 3 (three) times daily. 100 each 12   No facility-administered medications prior to visit.     ROS Review of Systems  Constitutional: Positive for fatigue. Negative for chills and fever.  Eyes: Negative for visual disturbance.  Respiratory: Negative for shortness of breath.   Cardiovascular: Negative for chest pain.  Gastrointestinal: Negative for abdominal pain and blood in stool.  Musculoskeletal: Negative for arthralgias and back pain.  Skin: Negative for rash.  Itching   Allergic/Immunologic: Negative for immunocompromised state.  Neurological: Positive for dizziness, light-headedness and headaches.  Hematological: Negative for adenopathy. Does not bruise/bleed easily.  Psychiatric/Behavioral: Negative for dysphoric mood and suicidal ideas.    Objective:  BP (!) 149/90 (BP Location: Left Arm, Patient Position: Sitting, Cuff Size: Small)   Pulse 64   Temp 98.2 F (36.8 C) (Oral)   Ht 5' 4.5" (1.638 m)   Wt 157 lb 12.8 oz (71.6 kg)   SpO2 96%   BMI 26.67 kg/m   BP/Weight 10/05/2016 07/30/2016 04/14/2582  Systolic BP 462 194 712  Diastolic BP 90 74 86  Wt. (Lbs) 157.8 158 148.6  BMI 26.67 26.7 25.11     Physical Exam  Constitutional: She is oriented to person, place, and time. She appears well-developed and well-nourished. No distress.  HENT:  Head: Normocephalic and atraumatic.  Cardiovascular: Normal rate, regular rhythm, normal heart sounds and intact distal pulses.   Pulmonary/Chest: Effort normal and breath sounds normal.  Musculoskeletal: She exhibits no edema.  Neurological: She is alert and oriented to person, place, and time.  Skin: Skin is warm and dry. No rash noted.  Psychiatric: She has a normal mood and affect.    Lab Results  Component Value Date   HGBA1C 6.2 04/21/2016   CBG 126  Assessment & Plan:   Linda George was seen today for allergies.  Diagnoses and all orders for this visit:  Diabetes mellitus without complication (HCC) -     HgB A1c -     Glucose (CBG)  Chronic non-seasonal allergic rhinitis, unspecified trigger -     Discontinue: cetirizine (ZYRTEC) 10 MG tablet; Take 1 tablet (10 mg total) by mouth daily. -     cetirizine (ZYRTEC) 10 MG tablet; Take 1 tablet (10 mg total) by mouth daily.  PTSD (post-traumatic stress disorder) -     buPROPion (WELLBUTRIN SR) 150 MG 12 hr tablet; Take 1 tablet (150 mg total) by mouth 2 (two) times daily.  Dizzy -     buPROPion (WELLBUTRIN SR) 150 MG 12 hr tablet; Take 1 tablet (150 mg total) by mouth 2 (two) times daily.  Essential hypertension -     hydrochlorothiazide (HYDRODIURIL) 12.5 MG tablet; Take 1 tablet (12.5 mg total) by mouth daily. -     losartan (COZAAR) 100 MG tablet; Take 1 tablet (100 mg total) by mouth daily.    No orders of the defined types were placed in this encounter.   Follow-up: Return in about 3 months (around 01/05/2017) for HTN .   Boykin Nearing MD

## 2016-10-05 NOTE — Assessment & Plan Note (Signed)
Elevated BP off HCTZ Plan: Refilled HCTZ Continue losartan

## 2016-11-03 MED FILL — FLUoxetine HCL 40 MG CAPS: 40 | 90 days supply | Qty: 90 | Fill #1

## 2016-11-03 MED FILL — LEVOTHYROXINE 75 MCG TABLET: 75 | 90 days supply | Qty: 90 | Fill #1

## 2016-11-03 MED FILL — ALL DAY ALLERGY 10 MG TAB: 10 | 30 days supply | Qty: 30 | Fill #1

## 2016-12-16 ENCOUNTER — Encounter: Payer: Self-pay | Admitting: Family Medicine

## 2017-01-05 ENCOUNTER — Ambulatory Visit: Payer: Medicaid Other | Admitting: Family Medicine

## 2017-01-15 IMAGING — CT CT HEAD W/O CM
3 of 4 series · 15 of 47 positions shown, 18 images · non-contrast
Comparison: None.

CLINICAL DATA: Bilateral temporal headache for 1 year, dizziness
with bending over.

EXAM:
CT HEAD WITHOUT CONTRAST
TECHNIQUE: Contiguous axial images were obtained from the base of the skull
through the vertex without intravenous contrast.

[Series 3: head 2.0 h70h · axial · 0.46mm/px · z∈[+1392,+1510]mm · 9 of 75 slices shown, 12 images]
[im 8/75  brain]
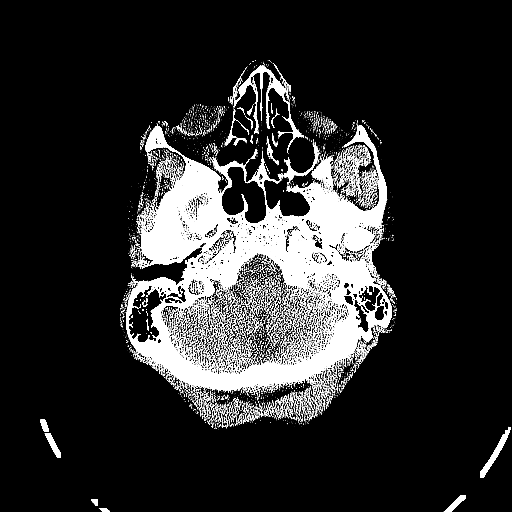
[im 8/75  bone]
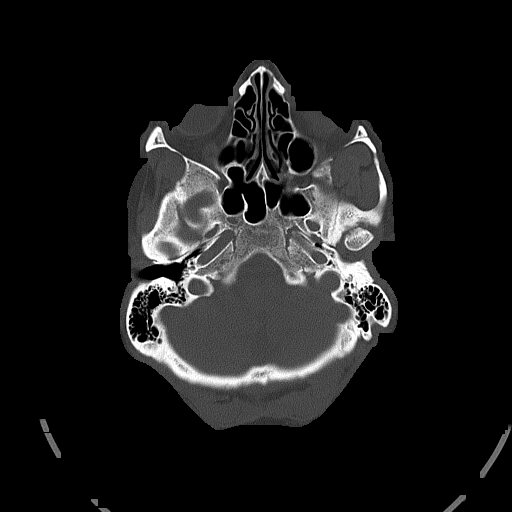
[im 15/75  brain]
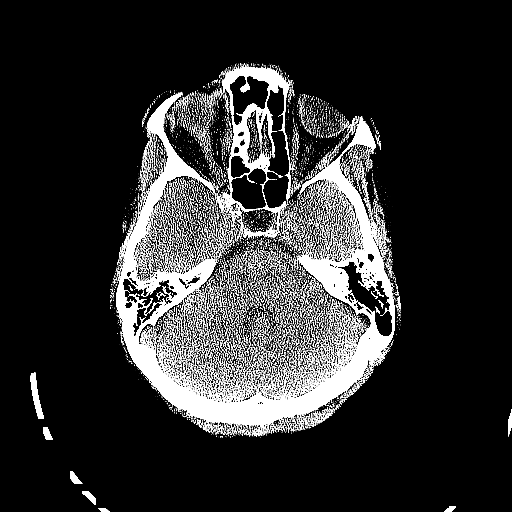
[im 23/75  brain]
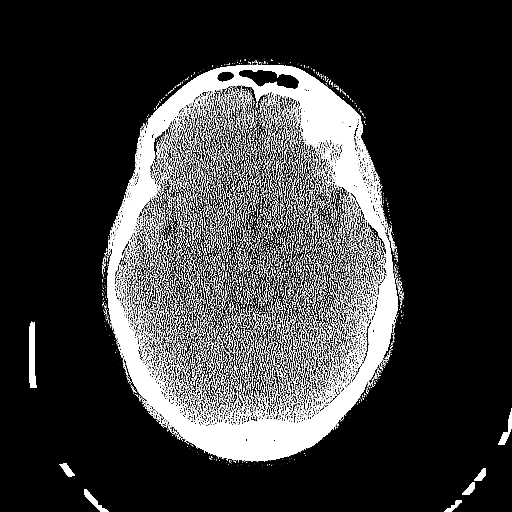
[im 30/75  brain]
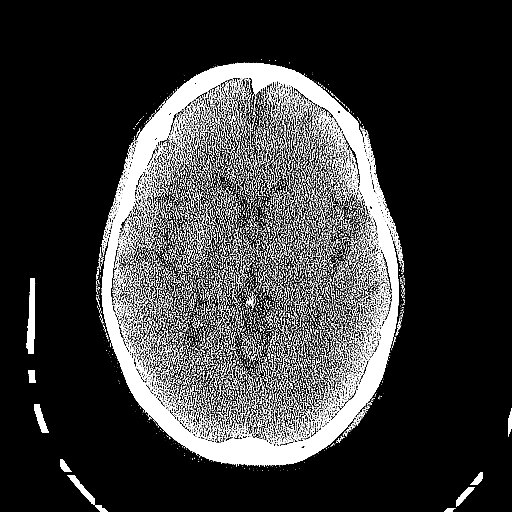
[im 38/75  brain]
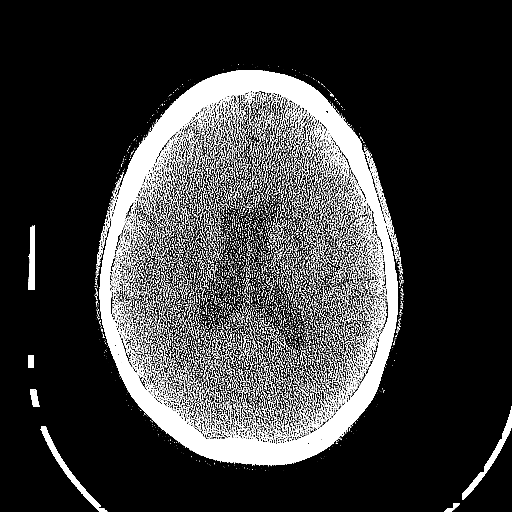
[im 38/75  bone]
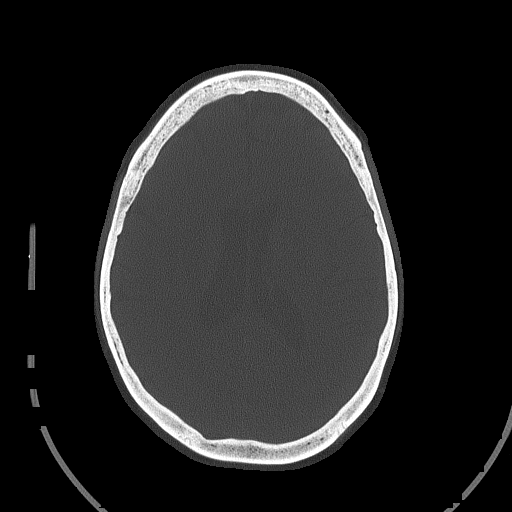
[im 45/75  brain]
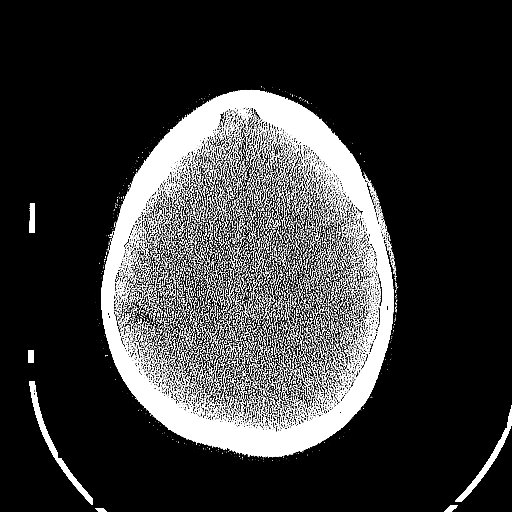
[im 52/75  brain]
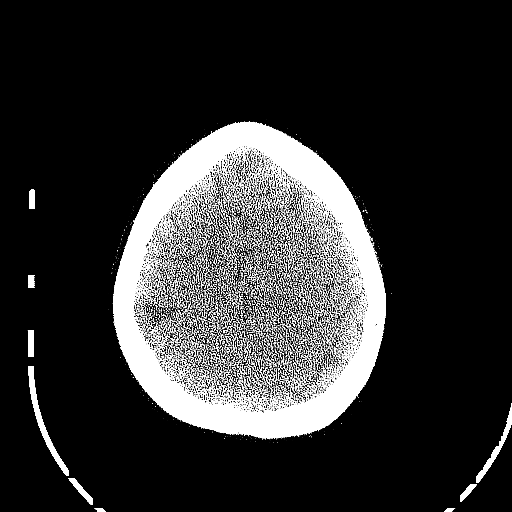
[im 60/75  brain]
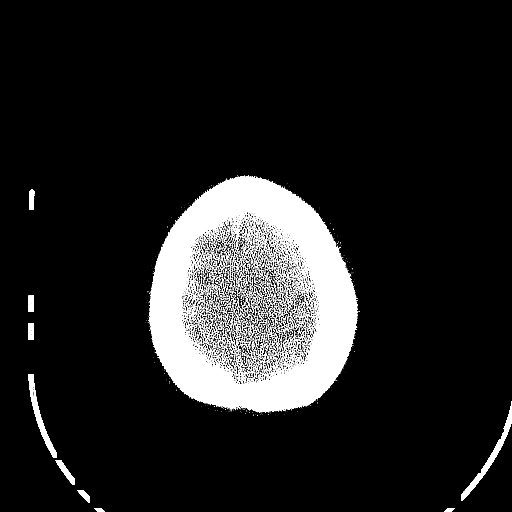
[im 67/75  brain]
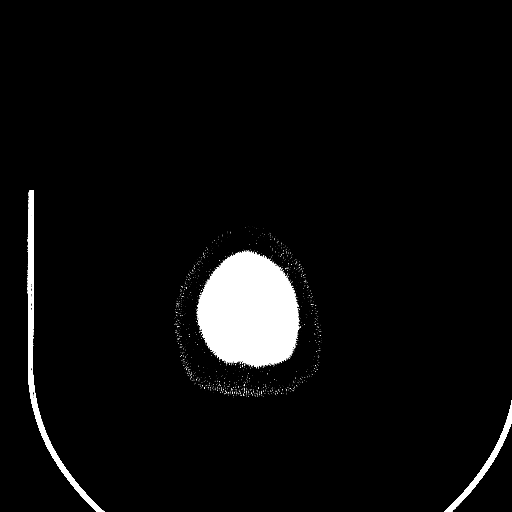
[im 67/75  bone]
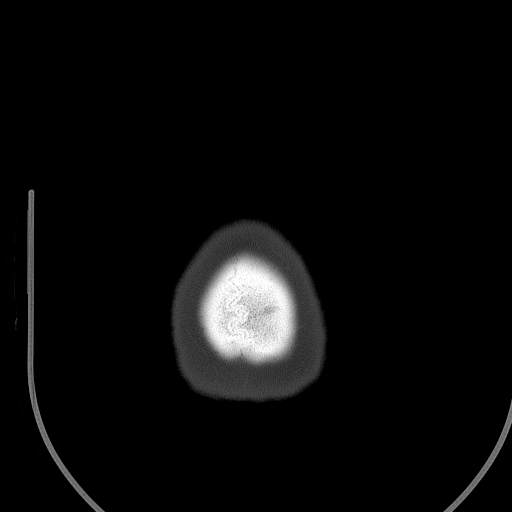

[Series 4: head 3.0 mpr · coronal · 0.33mm/px · 3 of 67 slices shown (1 of 2)]
[im 23/67  brain]
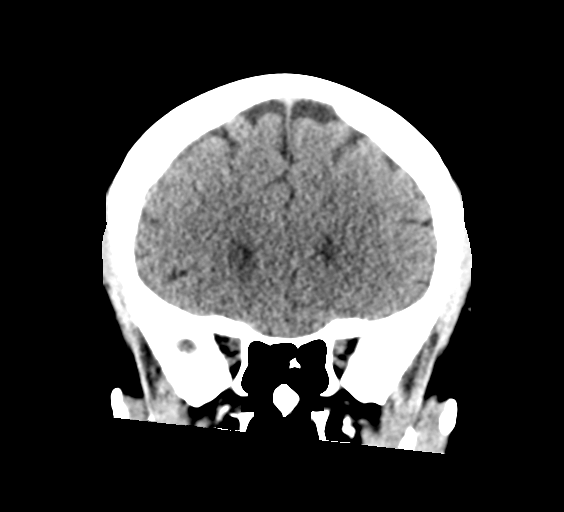
[im 30/67  brain]
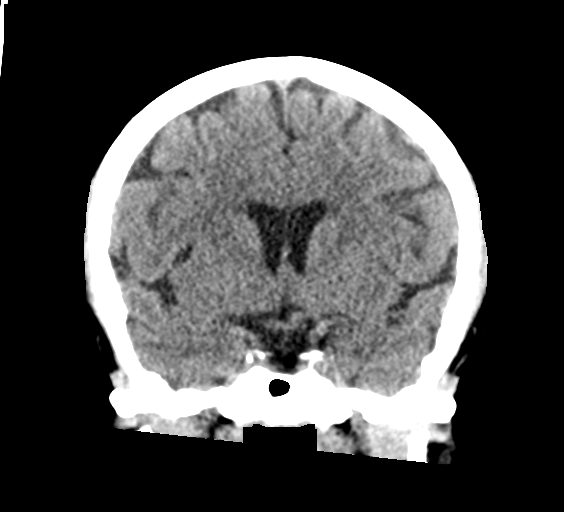
[im 37/67  brain]
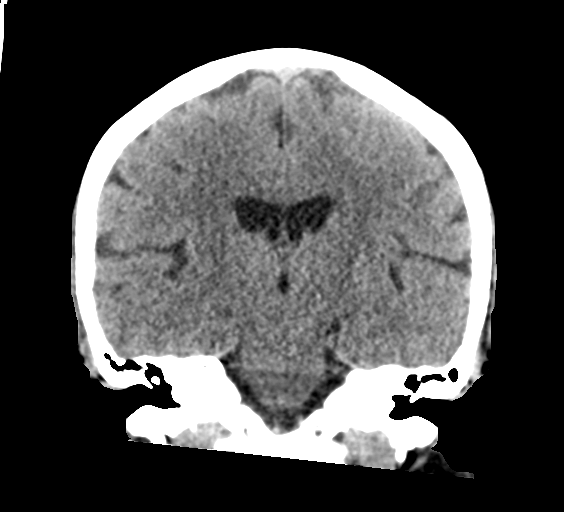

[Series 5: head 3.0 mpr · sagittal · 0.32mm/px · 3 of 67 slices shown (2 of 2)]
[im 23/67  brain]
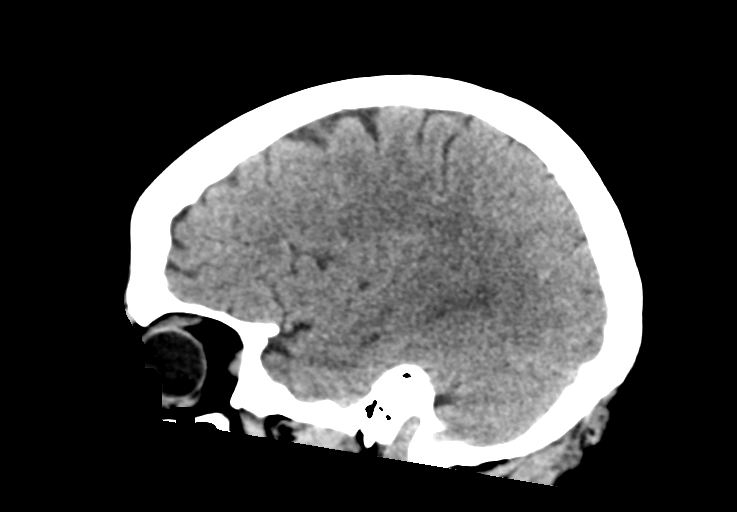
[im 34/67  brain]
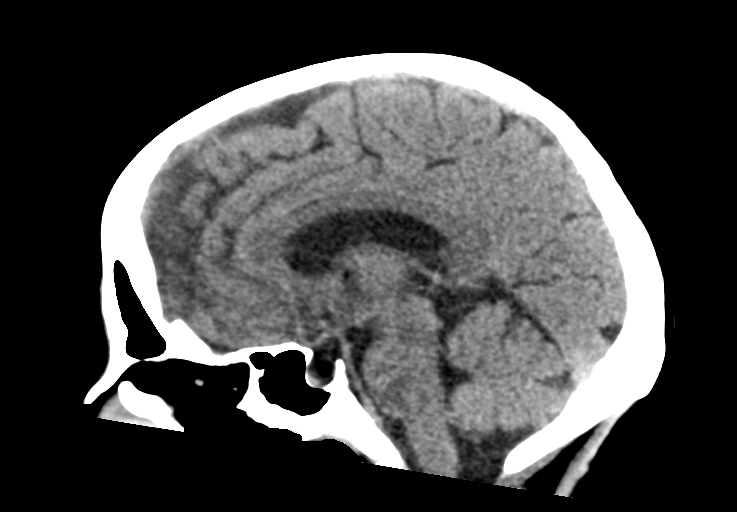
[im 45/67  brain]
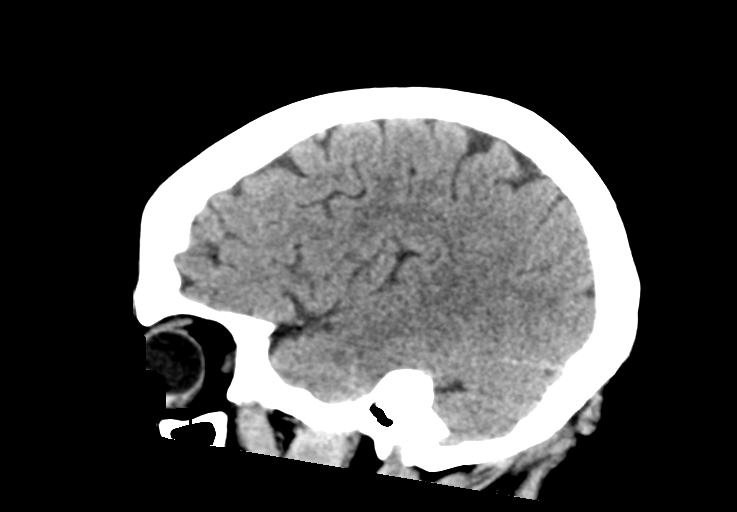

[15 of 47 positions shown; findings below may reference images not displayed]

FINDINGS: Brain: No acute intracranial abnormality. Specifically, no
hemorrhage, hydrocephalus, mass lesion, acute infarction, or
significant intracranial injury.

Vascular: No hyperdense vessel or unexpected calcification.

Skull: No acute calvarial abnormality

Sinuses/Orbits: Visualized paranasal sinuses and mastoids clear.
Orbital soft tissues unremarkable.

Other: None
IMPRESSION: No acute intracranial abnormality.

## 2017-01-29 MED FILL — GABAPENTIN 100 MG CAP: 100 | 37 days supply | Qty: 90 | Fill #1

## 2017-01-29 MED FILL — LEVOTHYROXINE 75 MCG TABLET: 75 | 90 days supply | Qty: 90 | Fill #2

## 2017-01-29 MED FILL — HYDROCHLOROTHIAZIDE 12.5 MG: 12.5 | 90 days supply | Qty: 90 | Fill #1

## 2017-01-29 MED FILL — FLUoxetine HCL 40 MG CAPS: 40 | 90 days supply | Qty: 90 | Fill #2

## 2017-01-29 MED FILL — metFORMIN HCL 1000 MG TABS: 1000 | 30 days supply | Qty: 30 | Fill #1

## 2017-01-29 MED FILL — LOSARTAN POTASSIUM 100 MG T: 100 | 90 days supply | Qty: 90 | Fill #1

## 2017-02-01 MED FILL — ALL DAY ALLERGY 10 MG TAB: 10 | 30 days supply | Qty: 30 | Fill #2

## 2017-02-08 ENCOUNTER — Encounter: Payer: Self-pay | Admitting: Pharmacist

## 2017-02-08 ENCOUNTER — Ambulatory Visit: Payer: Medicaid Other | Attending: Family Medicine | Admitting: Family Medicine

## 2017-02-08 ENCOUNTER — Encounter: Payer: Self-pay | Admitting: Family Medicine

## 2017-02-08 VITALS — BP 161/92 | HR 67 | Temp 97.9°F | Ht 64.5 in | Wt 167.2 lb

## 2017-02-08 DIAGNOSIS — Z79899 Other long term (current) drug therapy: Secondary | ICD-10-CM | POA: Insufficient documentation

## 2017-02-08 DIAGNOSIS — Z7982 Long term (current) use of aspirin: Secondary | ICD-10-CM | POA: Insufficient documentation

## 2017-02-08 DIAGNOSIS — F4310 Post-traumatic stress disorder, unspecified: Secondary | ICD-10-CM | POA: Diagnosis not present

## 2017-02-08 DIAGNOSIS — G44229 Chronic tension-type headache, not intractable: Secondary | ICD-10-CM | POA: Diagnosis not present

## 2017-02-08 DIAGNOSIS — E119 Type 2 diabetes mellitus without complications: Secondary | ICD-10-CM | POA: Insufficient documentation

## 2017-02-08 DIAGNOSIS — E039 Hypothyroidism, unspecified: Secondary | ICD-10-CM

## 2017-02-08 DIAGNOSIS — I10 Essential (primary) hypertension: Secondary | ICD-10-CM | POA: Diagnosis not present

## 2017-02-08 DIAGNOSIS — E785 Hyperlipidemia, unspecified: Secondary | ICD-10-CM

## 2017-02-08 DIAGNOSIS — Z7984 Long term (current) use of oral hypoglycemic drugs: Secondary | ICD-10-CM | POA: Insufficient documentation

## 2017-02-08 DIAGNOSIS — E1169 Type 2 diabetes mellitus with other specified complication: Secondary | ICD-10-CM | POA: Diagnosis not present

## 2017-02-08 LAB — POCT GLYCOSYLATED HEMOGLOBIN (HGB A1C): HEMOGLOBIN A1C: 8.9

## 2017-02-08 LAB — GLUCOSE, POCT (MANUAL RESULT ENTRY): POC GLUCOSE: 245 mg/dL — AB (ref 70–99)

## 2017-02-08 MED ORDER — GABAPENTIN 100 MG PO CAPS: 200.0000 mg | ORAL_CAPSULE | Freq: Two times a day (BID) | ORAL | 3 refills | Status: DC

## 2017-02-08 MED ORDER — METFORMIN HCL 1000 MG PO TABS: ORAL_TABLET | ORAL | 5 refills | Status: DC

## 2017-02-08 NOTE — Progress Notes (Signed)
[place] Medicaid prior auth completed for losartan. Pending #[id number]

## 2017-02-08 NOTE — Assessment & Plan Note (Signed)
A: elevated BP  Med: not taking losartan P: Restart losartan 100 mg daily Continue HCTZ 12.5 mg daily with plain to increase to 25 mg if needed

## 2017-02-08 NOTE — Assessment & Plan Note (Signed)
Increase gabapentin to 200 mg BID Control BP

## 2017-02-08 NOTE — Progress Notes (Signed)
Subjective:  Patient ID: Linda George, female    DOB: [birth year]  Age: 56 y.o. MRN: [id number]  CC: Diabetes   HPI Linda George has PTSD, chronic tension headache, HTN,  DM2, hypothyroidism she  presents for    1. CHRONIC DIABETES  Disease Monitoring  Blood Sugar Ranges: not checking   Polyuria: no   Visual problems: no   Medication Compliance: no, taking 500 mg once daily in AM  Medication Side Effects  Hypoglycemia: no   Preventitive Health Care  Eye Exam: due   Foot Exam: done recently   Diet pattern: eating high carb foods and drinks   Exercise: no, taking care of her sister in [place] who was in a MVA, and not taking care of herself.    2. CHRONIC HYPERTENSION  Disease Monitoring  Blood pressure range: not checking   Chest pain: no   Dyspnea: no   Claudication: no   Medication compliance: taking losartan, has ran out of HCTZ  Medication Side Effects  Lightheadedness: no   Urinary frequency: no   Edema: no    3. Hypothyroidism: taking synthroid. Not exercising. Has gained weight.   Social History  Substance Use Topics  . Smoking status: Never Smoker  . Smokeless tobacco: Never Used  . Alcohol use No    Outpatient Medications Prior to Visit  Medication Sig Dispense Refill  . aspirin 81 MG tablet Take 1 tablet by mouth daily.    . Blood Glucose Monitoring Suppl (TRUERESULT BLOOD GLUCOSE) W/DEVICE KIT 1 each by Does not apply route 3 (three) times daily before meals. 1 each 0  . buPROPion (WELLBUTRIN SR) 150 MG 12 hr tablet Take 1 tablet (150 mg total) by mouth 2 (two) times daily. 60 tablet 0  . cetirizine (ZYRTEC) 10 MG tablet Take 1 tablet (10 mg total) by mouth daily. 90 tablet 3  . FLUoxetine (PROZAC) 40 MG capsule Take 1 capsule (40 mg total) by mouth daily. 90 capsule 3  . fluticasone (FLONASE) 50 MCG/ACT nasal spray Place 2 sprays into both nostrils daily. (Patient taking differently: Place 2 sprays into both nostrils daily as needed for  allergies. ) 48 g 3  . gabapentin (NEURONTIN) 100 MG capsule Take 1 capsule (100 mg total) by mouth 2 (two) times daily. 180 capsule 3  . glucose blood (TRUETEST TEST) test strip 1 each by Other route 3 (three) times daily. Use as instructed 300 each 3  . hydrochlorothiazide (HYDRODIURIL) 12.5 MG tablet Take 1 tablet (12.5 mg total) by mouth daily. 90 tablet 3  . levothyroxine (SYNTHROID, LEVOTHROID) 75 MCG tablet TAKE 1 TABLET BY MOUTH DAILY BEFORE BREAKFAST. 90 tablet 3  . losartan (COZAAR) 100 MG tablet Take 1 tablet (100 mg total) by mouth daily. 90 tablet 3  . meclizine (ANTIVERT) 25 MG tablet Take 1 tablet (25 mg total) by mouth 3 (three) times daily as needed for dizziness. 30 tablet 3  . metFORMIN (GLUCOPHAGE) 1000 MG tablet TAKE 1/2 TABLET BY MOUTH 2 TIMES DAILY WITH A MEAL 90 tablet 3  . TRUEPLUS LANCETS 28G MISC 1 each by Does not apply route 3 (three) times daily. 100 each 12   No facility-administered medications prior to visit.     ROS Review of Systems  Constitutional: Positive for fatigue. Negative for chills and fever.  Eyes: Negative for visual disturbance.  Respiratory: Negative for shortness of breath.   Cardiovascular: Negative for chest pain.  Gastrointestinal: Negative for abdominal pain and blood in stool.  Musculoskeletal:  Negative for arthralgias and back pain.  Skin: Negative for rash.  Allergic/Immunologic: Negative for immunocompromised state.  Neurological: Positive for dizziness, light-headedness and headaches.  Hematological: Negative for adenopathy. Does not bruise/bleed easily.  Psychiatric/Behavioral: Negative for dysphoric mood and suicidal ideas.    Objective:  BP (!) 161/92   Pulse 67   Temp 97.9 F (36.6 C) (Oral)   Ht 5' 4.5" (1.638 m)   Wt 167 lb 3.2 oz (75.8 kg)   SpO2 96%   BMI 28.26 kg/m   BP/Weight 02/08/2017 10/05/2016 03/50/0938  Systolic BP 182 993 716  Diastolic BP 92 90 74  Wt. (Lbs) 167.2 157.8 158  BMI 28.26 26.67 26.7     Physical Exam  Constitutional: She is oriented to person, place, and time. She appears well-developed and well-nourished. No distress.  HENT:  Head: Normocephalic and atraumatic.  Cardiovascular: Normal rate, regular rhythm, normal heart sounds and intact distal pulses.   Pulmonary/Chest: Effort normal and breath sounds normal.  Musculoskeletal: She exhibits no edema.  Neurological: She is alert and oriented to person, place, and time.  Skin: Skin is warm and dry. No rash noted.  Psychiatric: She has a normal mood and affect.    Lab Results  Component Value Date   HGBA1C 6.1 10/05/2016   Lab Results  Component Value Date   HGBA1C 8.9 02/08/2017   Lab Results  Component Value Date   TSH 3.18 07/30/2016    CBG 245   Assessment & Plan:   Elliotte was seen today for diabetes.  Diagnoses and all orders for this visit:  Diabetes mellitus without complication ([place]) -     POCT glucose (manual entry) -     POCT glycosylated hemoglobin (Hb A1C) -     metFORMIN (GLUCOPHAGE) 1000 MG tablet; Take by mouth 1000 mg once daily, increase to 1000 twice daily after one week -     Lipid Panel  Hypothyroidism, unspecified type -     TSH  Chronic tension-type headache, not intractable -     gabapentin (NEURONTIN) 100 MG capsule; Take 2 capsules (200 mg total) by mouth 2 (two) times daily.  Essential hypertension    No orders of the defined types were placed in this encounter.   Follow-up: Return in about 3 months (around 05/11/2017) for HTN and diabetes .   Linda George

## 2017-02-08 NOTE — Assessment & Plan Note (Signed)
Declined with rise in A1c with weight gain High sugar diet Not taking metformin as prescribed  Plan: Reduce sugar intake Increase exercise Increase metformin to 1000 mg BID

## 2017-02-08 NOTE — Patient Instructions (Addendum)
[name] was seen today for diabetes.  Diagnoses and all orders for this visit:  Diabetes mellitus without complication ([place]) -     POCT glucose (manual entry) -     POCT glycosylated hemoglobin (Hb A1C) -     metFORMIN (GLUCOPHAGE) 1000 MG tablet; Take by mouth 1000 mg once daily, increase to 1000 twice daily after one week -     Lipid Panel  Hypothyroidism, unspecified type -     TSH  Chronic tension-type headache, not intractable -     gabapentin (NEURONTIN) 100 MG capsule; Take 2 capsules (200 mg total) by mouth 2 (two) times daily.   F/u in 3 months for HTN and diabetes  Dr. Adrian Blackwater

## 2017-02-09 LAB — LIPID PANEL
CHOL/HDL RATIO: 3.6 ratio (ref 0.0–4.4)
Cholesterol, Total: 176 mg/dL (ref 100–199)
HDL: 49 mg/dL (ref >39)
LDL CALC: 108 mg/dL — AB (ref 0–99)
Triglycerides: 97 mg/dL (ref 0–149)
VLDL Cholesterol Cal: 19 mg/dL (ref 5–40)

## 2017-02-09 LAB — TSH: TSH: 8.99 u[IU]/mL — ABNORMAL HIGH (ref 0.450–4.500)

## 2017-02-09 MED ORDER — ATORVASTATIN CALCIUM 20 MG PO TABS: 20.0000 mg | ORAL_TABLET | Freq: Every day | ORAL | 3 refills | Status: DC

## 2017-02-09 NOTE — Assessment & Plan Note (Signed)
Cholesterol panel with slightly elevated LDL, given diabetes and HTN I recommend statin Lipitor 20 mg daily and LDL goal of < 100

## 2017-02-09 NOTE — Assessment & Plan Note (Signed)
TSH is elevated patient advised to take synthroid every morning, please verify that she has been taking it with no missed doses. If she has the dose will need to be increased to 88 mcg daily  If she has not she is to continue the current dose of 75 mcg daily

## 2017-02-09 NOTE — Addendum Note (Signed)
Addended by: Boykin Nearing on: 02/09/2017 08:20 AM   Modules accepted: Orders

## 2017-02-10 ENCOUNTER — Telehealth: Payer: Self-pay

## 2017-02-10 NOTE — Telephone Encounter (Signed)
Pt was called and informed of lab results and medication being sent to pharmacy.

## 2017-03-17 ENCOUNTER — Telehealth: Payer: Self-pay

## 2017-03-17 DIAGNOSIS — E039 Hypothyroidism, unspecified: Secondary | ICD-10-CM

## 2017-03-17 DIAGNOSIS — E119 Type 2 diabetes mellitus without complications: Secondary | ICD-10-CM

## 2017-03-17 DIAGNOSIS — G44229 Chronic tension-type headache, not intractable: Secondary | ICD-10-CM

## 2017-03-17 DIAGNOSIS — R42 Dizziness and giddiness: Secondary | ICD-10-CM

## 2017-03-17 DIAGNOSIS — F4310 Post-traumatic stress disorder, unspecified: Secondary | ICD-10-CM

## 2017-03-17 DIAGNOSIS — I10 Essential (primary) hypertension: Secondary | ICD-10-CM

## 2017-03-17 NOTE — Telephone Encounter (Signed)
Patient is requesting a 90 day supply and will be reestablishing with you. Please advise your CMA accordingly.

## 2017-03-17 NOTE — Telephone Encounter (Signed)
Pt is requesting medications to be sent for a 90 day supply. Per Dr. Adrian Blackwater lab note she is up the dose of the synthroid. Also pt is in [place] and would like her medications be sent to th pharmacy. Pt is needing routine medications refilled. Please f/u   Pt is wanting rx's sent to [facility].

## 2017-03-18 ENCOUNTER — Other Ambulatory Visit: Payer: Self-pay

## 2017-03-18 DIAGNOSIS — E785 Hyperlipidemia, unspecified: Principal | ICD-10-CM

## 2017-03-18 DIAGNOSIS — E1169 Type 2 diabetes mellitus with other specified complication: Secondary | ICD-10-CM

## 2017-03-18 NOTE — Telephone Encounter (Signed)
[facility] [address] [place] [address]

## 2017-03-19 MED ORDER — CETIRIZINE HCL 10 MG PO TABS: 10.0000 mg | ORAL_TABLET | Freq: Every day | ORAL | 0 refills | Status: DC

## 2017-03-19 MED ORDER — BUPROPION HCL ER (SR) 150 MG PO TB12: 150.0000 mg | ORAL_TABLET | Freq: Two times a day (BID) | ORAL | 0 refills | Status: DC

## 2017-03-19 MED ORDER — LEVOTHYROXINE SODIUM 75 MCG PO TABS: ORAL_TABLET | ORAL | 0 refills | Status: DC

## 2017-03-19 MED ORDER — LOSARTAN POTASSIUM 100 MG PO TABS: 100.0000 mg | ORAL_TABLET | Freq: Every day | ORAL | 0 refills | Status: DC

## 2017-03-19 MED ORDER — GABAPENTIN 100 MG PO CAPS: 200.0000 mg | ORAL_CAPSULE | Freq: Two times a day (BID) | ORAL | 0 refills | Status: DC

## 2017-03-19 MED ORDER — METFORMIN HCL 1000 MG PO TABS: ORAL_TABLET | ORAL | 5 refills | Status: DC

## 2017-03-19 MED ORDER — FLUOXETINE HCL 40 MG PO CAPS: 40.0000 mg | ORAL_CAPSULE | Freq: Every day | ORAL | 0 refills | Status: DC

## 2017-03-19 MED ORDER — HYDROCHLOROTHIAZIDE 12.5 MG PO TABS: 12.5000 mg | ORAL_TABLET | Freq: Every day | ORAL | 0 refills | Status: DC

## 2017-03-19 NOTE — Addendum Note (Signed)
Addended by: Karle Plumber B on: 03/19/2017 12:25 PM   Modules accepted: Orders

## 2017-03-31 ENCOUNTER — Telehealth: Payer: Self-pay | Admitting: Family Medicine

## 2017-03-31 NOTE — Telephone Encounter (Signed)
Patient called states she is unable to get medications due to medicaid not covering out of state medication. Pt would like to talk to nurse to see what she should do. Please f/up

## 2017-03-31 NOTE — Telephone Encounter (Signed)
Patient needs to go to an UC/ED  or PCP in the state she is in and have the medication reprinted and filled at a pharmacy where she is located.

## 2017-05-06 ENCOUNTER — Telehealth: Payer: Self-pay | Admitting: Internal Medicine

## 2017-05-06 ENCOUNTER — Other Ambulatory Visit: Payer: Self-pay | Admitting: Pharmacist

## 2017-05-06 DIAGNOSIS — E785 Hyperlipidemia, unspecified: Principal | ICD-10-CM

## 2017-05-06 DIAGNOSIS — E1169 Type 2 diabetes mellitus with other specified complication: Secondary | ICD-10-CM

## 2017-05-06 DIAGNOSIS — G44229 Chronic tension-type headache, not intractable: Secondary | ICD-10-CM

## 2017-05-06 DIAGNOSIS — E039 Hypothyroidism, unspecified: Secondary | ICD-10-CM

## 2017-05-06 DIAGNOSIS — R42 Dizziness and giddiness: Secondary | ICD-10-CM

## 2017-05-06 DIAGNOSIS — F4310 Post-traumatic stress disorder, unspecified: Secondary | ICD-10-CM

## 2017-05-06 DIAGNOSIS — E119 Type 2 diabetes mellitus without complications: Secondary | ICD-10-CM

## 2017-05-06 DIAGNOSIS — I10 Essential (primary) hypertension: Secondary | ICD-10-CM

## 2017-05-06 MED ORDER — HYDROCHLOROTHIAZIDE 12.5 MG PO TABS: 12.5000 mg | ORAL_TABLET | Freq: Every day | ORAL | 0 refills | Status: DC

## 2017-05-06 MED ORDER — GABAPENTIN 100 MG PO CAPS: 200.0000 mg | ORAL_CAPSULE | Freq: Two times a day (BID) | ORAL | 0 refills | Status: DC

## 2017-05-06 MED ORDER — GLUCOSE BLOOD VI STRP: 1.0000 | ORAL_STRIP | Freq: Three times a day (TID) | 0 refills | Status: AC

## 2017-05-06 MED ORDER — LOSARTAN POTASSIUM 100 MG PO TABS: 100.0000 mg | ORAL_TABLET | Freq: Every day | ORAL | 0 refills | Status: DC

## 2017-05-06 MED ORDER — LEVOTHYROXINE SODIUM 75 MCG PO TABS: ORAL_TABLET | ORAL | 0 refills | Status: DC

## 2017-05-06 MED ORDER — ATORVASTATIN CALCIUM 20 MG PO TABS: 20.0000 mg | ORAL_TABLET | Freq: Every day | ORAL | 0 refills | Status: DC

## 2017-05-06 MED ORDER — FLUOXETINE HCL 40 MG PO CAPS: 40.0000 mg | ORAL_CAPSULE | Freq: Every day | ORAL | 0 refills | Status: DC

## 2017-05-06 MED ORDER — BUPROPION HCL ER (SR) 150 MG PO TB12: 150.0000 mg | ORAL_TABLET | Freq: Two times a day (BID) | ORAL | 0 refills | Status: DC

## 2017-05-06 MED ORDER — FLUTICASONE PROPIONATE 50 MCG/ACT NA SUSP: 2.0000 | Freq: Every day | NASAL | 0 refills | Status: AC

## 2017-05-06 MED ORDER — CETIRIZINE HCL 10 MG PO TABS: 10.0000 mg | ORAL_TABLET | Freq: Every day | ORAL | 0 refills | Status: AC

## 2017-05-06 MED ORDER — METFORMIN HCL 1000 MG PO TABS: 1000.0000 mg | ORAL_TABLET | Freq: Two times a day (BID) | ORAL | 0 refills | Status: DC

## 2017-05-07 MED FILL — TRUE METRIX TEST STRIP: 30 days supply | Qty: 100 | Fill #0

## 2017-05-07 MED FILL — ?CETIRIZINE HCL 10 MG TABLE: 10 | 30 days supply | Qty: 30 | Fill #0

## 2017-05-07 MED FILL — BUPROPION SR 150 MG TABLET: 150 | 30 days supply | Qty: 60 | Fill #0

## 2017-05-07 MED FILL — GABAPENTIN 100 MG CAP: 100 | 30 days supply | Qty: 120 | Fill #0

## 2017-05-07 MED FILL — ?HYDROCHLOROTHIAZIDE 12.5MG: 12.5 | 30 days supply | Qty: 30 | Fill #0

## 2017-05-07 MED FILL — LEVOTHYROXINE 75 MCG TABLET: 75 | 30 days supply | Qty: 30 | Fill #0

## 2017-05-07 MED FILL — ?ATORVASTATIN 20 MG TABLET: 20 | 30 days supply | Qty: 30 | Fill #0

## 2017-05-07 MED FILL — LOSARTAN POTASSIUM 100 MG T: 100 | 30 days supply | Qty: 30 | Fill #0

## 2017-05-07 MED FILL — FLUTICASONE PROP 50 MCG SPR: 50 | 30 days supply | Qty: 16 | Fill #0

## 2017-05-07 MED FILL — FLUoxetine HCL 40 MG CAPS: 40 | 30 days supply | Qty: 30 | Fill #0

## 2017-05-13 ENCOUNTER — Ambulatory Visit: Payer: Medicaid Other | Admitting: Internal Medicine

## 2017-06-07 MED FILL — metFORMIN HCL 1000 MG TABS: 1000 | 90 days supply | Qty: 90 | Fill #2

## 2017-07-19 ENCOUNTER — Ambulatory Visit: Payer: Self-pay | Admitting: Internal Medicine

## 2018-11-12 NOTE — Telephone Encounter (Signed)
done

## 2020-12-31 ENCOUNTER — Other Ambulatory Visit: Payer: Self-pay

## 2020-12-31 ENCOUNTER — Ambulatory Visit (INDEPENDENT_AMBULATORY_CARE_PROVIDER_SITE_OTHER): Payer: No Payment, Other | Admitting: Licensed Clinical Social Worker

## 2020-12-31 DIAGNOSIS — F322 Major depressive disorder, single episode, severe without psychotic features: Secondary | ICD-10-CM

## 2020-12-31 DIAGNOSIS — F4310 Post-traumatic stress disorder, unspecified: Secondary | ICD-10-CM

## 2020-12-31 NOTE — Progress Notes (Signed)
Comprehensive Clinical Assessment (CCA) Note  12/31/2020 Linda George [id number]  Chief Complaint:  Chief Complaint  Patient presents with  . Depression   Visit Diagnosis: Major depression   Virtual Visit via Telephone Note  I connected with Linda George on 12/31/20 at 10:00 AM EDT by telephone and verified that I am speaking with the correct person using two identifiers.  Location: Patient: Linda George  Provider: [facility]    I discussed the limitations, risks, security and privacy concerns of performing an evaluation and management service by telephone and the availability of in person appointments. I also discussed with the patient that there may be a patient responsible charge related to this service. The patient expressed understanding and agreed to proceed.  Client is a 60 year old female. Client is referred by self for a depression.   Client states mental health symptoms as evidenced by:   Depression Change in energy/activity; Difficulty Concentrating; Fatigue; Hopelessness; Increase/decrease in appetite; Sleep (too much or little); Tearfulness; Weight gain/loss Change in energy/activity; Difficulty Concentrating; Fatigue; Hopelessness; Increase/decrease in appetite; Sleep (too much or little); Tearfulness; Weight gain/loss  Duration of Depressive Symptoms Less than two weeks Less than two weeks      Anxiety Worrying; Tension; Fatigue; Difficulty concentrating Worrying; Tension; Fatigue; Difficulty concentrating  Psychosis None None  Trauma Avoids reminders of event; Emotional numbing; Guilt/shame; Re-experience of traumatic event Avoids reminders of event; Emotional numbing; Guilt/shame; Re-experience of traumatic event    Client denies suicidal and homicidal ideations currently  Client denies hallucinations and delusions currently  Client was screened for the following SDOH: Financials, transportation, exercise, stress/tension, social interaction,  depression   Assessment Information that integrates subjective and objective details with a therapist's professional interpretation:    Pt was alert and oriented x 5. She was not observed as assessment was conducted via phone. Linda George presented with flat, slowed, anxious, and depressed mood/affect. She was cooperative and engaged well in session.   Pt presents with a Hx of PTSD and Major depression. She scored a 20 on her PHQ-9. She is not currently taking any medication. Pt states that he has does not believe in suicide as her religion considers this a sin. 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 She is currently staying in senior living apartments that her children help pay for. Pt does not currently have income but is attempting to get SSDI. Talayla states that she has 1 friend, and her 3 children are her primary supports. She states that her relationship with her children is not as great as she would like as she reports she has made mistakes with her depression. Elianie has been through 2 divorces, both she states were not true love and she rushed into it.  She asked today for a letter for emotional support dog. LCSW stated that without meeting the dog or seeing the pt on a regular basis that a letter like that could not be considered. Pt explained she wants a puppy that is to be gifted to her by her daughter. The senior apartments that she lives in does not let dogs in with a doctors note. LCSW stated that other providers maybe more comfortable with writing that note without meeting the dog, but this writer was not for liability purposes. Goals for pt is to meet 1 x monthly to work on decreasing depression symptoms   Client meets criteria for: MDD    Client states use of the following substances: None reported    Treatment recommendations are included plan: Pt  want to work through her depression and mistakes that she has made raising her children while decreasing depressive symptoms.    Objectives: Pt to  decrease PHQ-9 below 10, pt to walk or excercise 2 x daily, pt to journal 1 x weekly  Clinician assisted client with scheduling the following appointments: July 1st Clinician details of appointment.    Client agreed with treatment recommendations.    I discussed the assessment and treatment plan with the patient. The patient was provided an opportunity to ask questions and all were answered. The patient agreed with the plan and demonstrated an understanding of the instructions.   The patient was advised to call back or seek an in-person evaluation if the symptoms worsen or if the condition fails to improve as anticipated.  I provided 55 minutes of non-face-to-face time during this encounter.   Dory Horn, LCSW  CCA Screening, Triage and Referral (STR)  Patient Reported Information How did you hear about [place]? Self  Referral name: use to get service with Ruthy Dick do you see for routine medical problems? I don't have a doctor  Have You Recently Been in Any Inpatient Treatment ([facility]/Detox/Crisis Center/28-Day Program)? No   Have You Ever Received Services From [facility] Before? Yes  Who Do You See at [facility]? 3 to 4 years ago   Have You Recently Had Any Thoughts About Hurting Yourself? No  Are You Planning to Commit Suicide/Harm Yourself At This time? No   Have you Recently Had Thoughts About [place]? No   Have You Used Any Alcohol or Drugs in the Past 24 Hours? No   Do You Currently Have a Therapist/Psychiatrist? No  Have You Been Recently Discharged From Any Office Practice or Programs? No    CCA Screening Triage Referral Assessment Type of Contact: Tele-Assessment  Is this Initial or Reassessment? Initial Assessment  Date Telepsych consult ordered in [facility]:  12/31/2020  Patient Reported Information Reviewed? Yes Is CPS involved or ever been involved? Never  Is APS involved or ever been involved? Never   Patient Determined To  Be At Risk for Harm To Self or Others Based on Review of Patient Reported Information or Presenting Complaint? No   Location of Assessment: [facility]   Does Patient Present under Involuntary Commitment? No  [place] of Residence: [place]    CCA Biopsychosocial Intake/Chief Complaint:  depression  Current Symptoms/Problems: sadness, hard time focusing, isolation, lack of motivation   Patient Reported Schizophrenia/Schizoaffective Diagnosis in Past: No   Strengths: family  Type of Services Patient Feels are Needed: therapy and medication mgmt   Initial Clinical Notes/Concerns: insomnia   Mental Health Symptoms Depression:  Change in energy/activity; Difficulty Concentrating; Fatigue; Hopelessness; Increase/decrease in appetite; Sleep (too much or little); Tearfulness; Weight gain/loss   Duration of Depressive symptoms: Less than two weeks   Mania:  No data recorded  Anxiety:   Worrying; Tension; Fatigue; Difficulty concentrating   Psychosis:  None   Duration of Psychotic symptoms: No data recorded  Trauma:  Avoids reminders of event; Emotional numbing; Guilt/shame; Re-experience of traumatic event   Obsessions:  N/A   Compulsions:  N/A   Inattention:  N/A   Hyperactivity/Impulsivity:  N/A   Oppositional/Defiant Behaviors:  N/A   Emotional Irregularity:  N/A   Other Mood/Personality Symptoms:  No data recorded   Mental Status Exam Appearance and self-care  Stature:  Average   Weight:  Average weight   Clothing:  -- (telaphone assessemnt conducted  no visual)   Grooming:  No data recorded  Cosmetic use:  No data recorded  Posture/gait:  No data recorded  Motor activity:  No data recorded  Sensorium  Attention:  Distractible   Concentration:  Anxiety interferes; Scattered   Orientation:  Time   Recall/memory:  Normal   Affect and Mood  Affect:  Anxious; Depressed; Flat   Mood:  Depressed; Hopeless; Worthless; Anxious    Relating  Eye contact:  None   Facial expression:  No data recorded  Attitude toward examiner:  Cooperative   Thought and Language  Speech flow: Clear and Coherent   Thought content:  Appropriate to Mood and Circumstances   Preoccupation:  No data recorded  Hallucinations:  No data recorded  Organization:  No data recorded  [employer] of Knowledge:  Fair   Intelligence:  Average   Abstraction:  Functional   Judgement:  Fair   Reality Testing:  No data recorded  Insight:  Fair   Decision Making:  No data recorded  Social Functioning  Social Maturity:  No data recorded  Social Judgement:  No data recorded  Stress  Stressors:  Museum/gallery curator; 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; Housing   Coping Ability:  Overwhelmed   Skill Deficits:  No data recorded  Supports:  Church; Family; Friends/Service system     Religion: Religion/Spirituality Are You A Religious Person?: Yes What is Your Religious Affiliation?: Jehovah's Witness  Leisure/Recreation: Leisure / Recreation Do You Have Hobbies?: Yes Leisure and Hobbies: sewing  Exercise/Diet: Exercise/Diet Do You Exercise?: No Have You Gained or Lost A Significant Amount of Weight in the Past Six Months?: Yes-Gained Number of Pounds Gained: 10 Do You Follow a Special Diet?: No Do You Have Any Trouble Sleeping?: Yes Explanation of Sleeping Difficulties: falling and staying asleep   CCA Employment/Education Employment/Work Situation: Employment / Work Situation Employment situation: Unemployed (attempting to get disability) Patient's job has been impacted by current illness: No Has patient ever been in the [employer]?: No  Education: Education Is Patient Currently Attending School?: No Last Grade Completed: 12 Did Teacher, adult education From [employer]?: Yes Did Physicist, medical?: Yes What Type of College Degree Do you Have?: 1.5 years of college did not graduate Did You Have An Individualized Education Program (IIEP):  No Did You Have Any Difficulty At School?: No Patient's Education Has Been Impacted by Current Illness: No   CCA Family/Childhood History Family and Relationship History: Family history Marital status: Divorced Divorced, when?: x 2 10 years ago anf 10 years Are you sexually active?: No What is your sexual orientation?: hetrosexual Does patient have children?: Yes How many children?: 3 How is patient's relationship with their children?: Pt feels that she has made mistakes and she does not have the love and respect she would like to have with them. Pt stays with youngest daughter  Childhood History:  Childhood History By whom was/is the patient raised?: Both parents Description of patient's relationship with caregiver when they were a child: good with mom and dad was reserved Patient's description of current relationship with people who raised him/her: Lost parents when she was an early adult Does patient have siblings?: Yes Number of Siblings: 11 Description of patient's current relationship with siblings: distant Did patient suffer any verbal/emotional/physical/sexual abuse as a child?: No Did patient suffer from severe childhood neglect?: No Has patient ever been sexually abused/assaulted/raped as an adolescent or adult?: No Was the patient ever a victim of a crime or a disaster?: No Witnessed domestic  violence?: No Has patient been affected by domestic violence as an adult?: No  Child/Adolescent Assessment:     CCA Substance Use Alcohol/Drug Use: Alcohol / Drug Use History of alcohol / drug use?: No history of alcohol / drug abuse      DSM5 Diagnoses: Patient Active Problem List   Diagnosis Date Noted  . Subjective memory complaints 04/26/2016  . Benign paroxysmal positional vertigo 01/03/2016  . PTSD (post-traumatic stress disorder) 04/10/2015  . Chronic fatigue 04/10/2015  . Vitamin D deficiency 03/27/2015  . Nonscarring hair loss 03/27/2015  . Lightheaded  01/03/2015  . Hyperlipidemia associated with type 2 diabetes mellitus ([place]) 11/01/2014  . Tension headache, chronic 10/26/2014  . Allergic rhinitis 10/26/2014  . Diabetes mellitus without complication ([place])   . Hypertension   . Depression   . Hypothyroidism      Dory Horn, LCSW

## 2021-01-20 ENCOUNTER — Ambulatory Visit (HOSPITAL_COMMUNITY): Payer: No Payment, Other | Admitting: Licensed Clinical Social Worker

## 2021-01-20 ENCOUNTER — Other Ambulatory Visit: Payer: Self-pay

## 2021-01-20 ENCOUNTER — Telehealth (HOSPITAL_COMMUNITY): Payer: Self-pay | Admitting: Licensed Clinical Social Worker

## 2021-01-20 NOTE — Telephone Encounter (Signed)
Sent two links to pt email listed in epic with no response. At 1510 LCSW called pt and phone went straight to VM. LCSW stayed in links until 1514

## 2021-01-27 ENCOUNTER — Other Ambulatory Visit: Payer: Self-pay

## 2021-01-27 ENCOUNTER — Encounter (HOSPITAL_COMMUNITY): Payer: Self-pay | Admitting: Psychiatry

## 2021-01-27 ENCOUNTER — Ambulatory Visit (INDEPENDENT_AMBULATORY_CARE_PROVIDER_SITE_OTHER): Payer: No Payment, Other | Admitting: Psychiatry

## 2021-01-27 VITALS — BP 185/94 | HR 84 | Ht 64.0 in | Wt 161.0 lb

## 2021-01-27 DIAGNOSIS — F332 Major depressive disorder, recurrent severe without psychotic features: Secondary | ICD-10-CM | POA: Diagnosis not present

## 2021-01-27 DIAGNOSIS — F411 Generalized anxiety disorder: Secondary | ICD-10-CM | POA: Insufficient documentation

## 2021-01-27 MED ORDER — HYDROXYZINE HCL 10 MG PO TABS
10.0000 mg | ORAL_TABLET | Freq: Three times a day (TID) | ORAL | 2 refills | Status: DC | PRN
  Filled 2021-01-27: qty 90, 30d supply, fill #0

## 2021-01-27 MED ORDER — BUPROPION HCL ER (XL) 150 MG PO TB24
150.0000 mg | ORAL_TABLET | ORAL | 2 refills | Status: DC
  Filled 2021-01-27: qty 30, 30d supply, fill #0
  Filled 2021-03-14: qty 60, 60d supply, fill #1
  Filled 2021-03-18 (×2): qty 30, 30d supply, fill #1
  Filled 2021-04-14: qty 30, 30d supply, fill #2

## 2021-01-27 NOTE — Progress Notes (Signed)
Psychiatric Initial Adult Assessment   Patient Identification: Linda George MRN:  [id number] Date of Evaluation:  01/27/2021 Referral Source: Essie Christine Chief Complaint:  "Prozac 40 gave me side effects" Visit Diagnosis:    ICD-10-CM   1. Severe episode of recurrent major depressive disorder, without psychotic features ([place])  F33.2 buPROPion (WELLBUTRIN XL) 150 MG 24 hr tablet    2. Generalized anxiety disorder  F41.1 hydrOXYzine (ATARAX/VISTARIL) 10 MG tablet      History of Present Illness: 60 year old female seen today for initial psychiatric evaluation.  She walked into the clinic for medication management.  She has a psychiatric history of depression and PTSD.  Currently she is being managed on Prozac 20 mg daily.  She notes that while in [place] she was taking Prozac 40 mg however notes that it caused side effects (mental fog and laps in memory) and was reduced in January 2022.  She notes that it is somewhat effective in managing her psychiatric conditions.  She has also tried gabapentin and Wellbutrin and notes that they were somewhat effective as well.    Today she is well-groomed, pleasant, cooperative, and engaged in conversation.  Patient speech is slow and her voice tone is decreased.  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  Patient informed Probation officer that she has been more depressed and anxious lately.  She notes her biggest problems is that her 3 children do not respect her.  She informed Probation officer that 20 years ago she got a divorce and became depressed after her divorce.  She informed Probation officer that she made a lot of bad decisions that affect her children.  She notes that she and her children were homeless at one time.  She also notes that she lost her home and her car.  Patient informed writer that the above stressors exacerbate her anxiety and depression.  Today provider conducted a GAD-7 and patient scored an 11.  Provider also conducted  a PHQ-9 and patient scored a 24.  She endorses poor appetite and decreased weight.  Patient notes that she sleeps approximately 10 to 12 hours nightly.  Patient informed Probation officer that she was living with her daughter in [place] however notes that her children are now paying for an apartment in [place].  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  Provider informed patient that she had to register her dog through the Department of Health and [employer].  Provider printed out the application and patient notes that she will attempt to fill out.  She also asked Probation officer to write a letter informing her landlord that her dog may be beneficial to her mental health.  Provider was at agreeable to this request and placed in the letter that patient was told to register her animal at the department of Health and [employer].  Patient notes that she experienced trauma after the death of her parents.  She also notes that she lost her brother to cancer which she reports was traumatic.  She denies having nightmares or avoidant behaviors however notes she has flashbacks.  Today she is agreeable to starting Wellbutrin XL 150 mg to help manage depression.  She is also agreeable to starting hydroxyzine 10 mg 3 times daily to help manage anxiety.  Patient informed that hydroxyzine should not be taking one of their antihistamine.  She endorsed understanding.  Potential side effects of medication  and risks vs benefits of treatment vs non-treatment were explained and discussed. All questions were answered. At this time she would like to discontinue Prozac.  She will follow-up with outpatient counseling for therapy.  No other concerns at this time. Associated Signs/Symptoms: Depression Symptoms:  depressed mood, anhedonia, hypersomnia, psychomotor agitation, fatigue, difficulty concentrating, hopelessness, suicidal thoughts without  plan, anxiety, loss of energy/fatigue, weight loss, decreased appetite, (Hypo) Manic Symptoms:  Distractibility, Flight of Ideas, Irritable Mood, Anxiety Symptoms:  Excessive Worry, Psychotic Symptoms:  Paranoia, PTSD Symptoms: Had a traumatic exposure:  Notes that she was homeless which was traumatic. Also notes she lost one of her brothers to caner and lost both parents.   Past Psychiatric History: Depression and PTSD  Previous Psychotropic Medications: No   Substance Abuse History in the last 12 months:  No.  Consequences of Substance Abuse: NA  Past Medical History:  Past Medical History:  Diagnosis Date   Allergy 20 years   seasonal   Depression 20 years   Diabetes mellitus without complication ([place]) 10 years   Hypertension 10 years   Hypothyroidism 7 years   hypo    Past Surgical History:  Procedure Laterality Date   CESAREAN SECTION     3 of them   COLONOSCOPY WITH PROPOFOL N/A 06/11/2015   Procedure: COLONOSCOPY WITH PROPOFOL;  Surgeon: Garlan Fair, MD;  Location: [facility];  Service: Endoscopy;  Laterality: N/A;    Family Psychiatric History: Daughter ADHD and depression  Family History:  Family History  Problem Relation Age of Onset   Diabetes Mother    Heart disease Mother    Hypertension Mother    Cancer Mother    Diabetes Father    Heart disease Father    Hypertension Father     Social History:   Social History   Socioeconomic History   Marital status: Single    Spouse name: Not on file   Number of children: Not on file   Years of education: Not on file   Highest education level: Not on file  Occupational History   Not on file  Tobacco Use   Smoking status: Never   Smokeless tobacco: Never  Substance and Sexual Activity   Alcohol use: No   Drug use: No   Sexual activity: Never  Other Topics Concern   Not on file  Social History Narrative   Not on file   Social Determinants of Health   Financial Resource Strain: High  Risk   Difficulty of Paying Living Expenses: Very hard  Food Insecurity: No Food Insecurity   Worried About Charity fundraiser in the Last Year: Never true   Ran Out of Food in the Last Year: Never true  Transportation Needs: Unmet Transportation Needs   Lack of Transportation (Medical): No   Lack of Transportation (Non-Medical): Yes  Physical Activity: Inactive   Days of Exercise per Week: 0 days   Minutes of Exercise per Session: 0 min  Stress: Stress Concern Present   Feeling of Stress : Very much  Social Connections: Socially Isolated   Frequency of Communication with Friends and Family: Once a week   Frequency of Social Gatherings with Friends and Family: Once a week   Attends Religious Services: More than 4 times per year   Active Member of [employer] or Organizations: No   Attends Archivist Meetings: Never   Marital Status: Divorced    Additional Social History: Patient resides in [place].  She is divorced and  has 3 children.  Currently she is unemployed and is seeking disability.  She denies tobacco, alcohol, or illegal drug use.  Allergies:   Allergies  Allergen Reactions   Ace Inhibitors Swelling    Metabolic Disorder Labs: Lab Results  Component Value Date   HGBA1C 8.9 02/08/2017   No results found for: PROLACTIN Lab Results  Component Value Date   CHOL 176 02/08/2017   TRIG 97 02/08/2017   HDL 49 02/08/2017   CHOLHDL 3.6 02/08/2017   VLDL 24 10/26/2014   LDLCALC 108 (H) 02/08/2017   LDLCALC 145 (H) 10/26/2014   Lab Results  Component Value Date   TSH 8.990 (H) 02/08/2017    Therapeutic Level Labs: No results found for: LITHIUM No results found for: CBMZ No results found for: VALPROATE  Current Medications: Current Outpatient Medications  Medication Sig Dispense Refill   aspirin 81 MG tablet Take 1 tablet by mouth daily.     atorvastatin (LIPITOR) 20 MG tablet Take 1 tablet (20 mg total) by mouth daily. 90 tablet 0   Blood Glucose  Monitoring Suppl (TRUERESULT BLOOD GLUCOSE) W/DEVICE KIT 1 each by Does not apply route 3 (three) times daily before meals. 1 each 0   buPROPion (WELLBUTRIN XL) 150 MG 24 hr tablet Take 1 tablet (150 mg total) by mouth every morning. 30 tablet 2   cetirizine (ZYRTEC) 10 MG tablet Take 1 tablet (10 mg total) by mouth daily. 90 tablet 0   FLUoxetine (PROZAC) 40 MG capsule Take 1 capsule (40 mg total) by mouth daily. 90 capsule 0   fluticasone (FLONASE) 50 MCG/ACT nasal spray Place 2 sprays into both nostrils daily. 48 g 0   gabapentin (NEURONTIN) 100 MG capsule Take 2 capsules (200 mg total) by mouth 2 (two) times daily. 360 capsule 0   glucose blood (TRUETEST TEST) test strip 1 each by Other route 3 (three) times daily. Use as instructed 300 each 0   hydrochlorothiazide (HYDRODIURIL) 12.5 MG tablet Take 1 tablet (12.5 mg total) by mouth daily. 90 tablet 0   hydrOXYzine (ATARAX/VISTARIL) 10 MG tablet Take 1 tablet (10 mg total) by mouth 3 (three) times daily as needed. 90 tablet 2   levothyroxine (SYNTHROID, LEVOTHROID) 75 MCG tablet TAKE 1 TABLET BY MOUTH DAILY BEFORE BREAKFAST. 90 tablet 0   losartan (COZAAR) 100 MG tablet Take 1 tablet (100 mg total) by mouth daily. 90 tablet 0   meclizine (ANTIVERT) 25 MG tablet Take 1 tablet (25 mg total) by mouth 3 (three) times daily as needed for dizziness. 30 tablet 3   metFORMIN (GLUCOPHAGE) 1000 MG tablet Take 1 tablet (1,000 mg total) by mouth 2 (two) times daily with a meal. Take by mouth 1000 mg once daily, increase to 1000 twice daily after one week 180 tablet 0   TRUEPLUS LANCETS 28G MISC 1 each by Does not apply route 3 (three) times daily. 100 each 12   No current facility-administered medications for this visit.    Musculoskeletal: Strength & Muscle Tone: within normal limits Gait & Station: normal Patient leans: N/A  Psychiatric Specialty Exam: Review of Systems  Blood pressure (!) 185/94, pulse 84, height _0  (1.626 m), weight 161 lb (73  kg).Body mass index is 27.64 kg/m.  General Appearance: Well Groomed  Eye Contact:  Good  Speech:  Clear and Coherent and Slow  Volume:  Decreased  Mood:  Anxious and Depressed  Affect:  Appropriate and Congruent  Thought Process:  Coherent, Goal Directed, and Linear  Orientation:  Full (Time, Place, and Person)  Thought Content:  WDL and Logical  Suicidal Thoughts:  No  Homicidal Thoughts:  No  Memory:  Immediate;   Good Recent;   Good Remote;   Good  Judgement:  Good  Insight:  Good  Psychomotor Activity:  Normal  Concentration:  Concentration: Good and Attention Span: Good  Recall:  Good  Fund of Knowledge:Good  Language: Good  Akathisia:  No  Handed:  Right  AIMS (if indicated):  not done  Assets:  Communication Skills Desire for Improvement Financial Resources/Insurance Housing Leisure Time Physical Health Social Support  ADL's:  Intact  Cognition: WNL  Sleep:  Fair   Screenings: GAD-7    Personnel officer Visit from 01/27/2021 in [facility] Office Visit from 02/08/2017 in [place] Office Visit from 10/05/2016 in [place] Office Visit from 07/30/2016 in [place] Office Visit from 04/21/2016 in [place]  Total GAD-7 Score _0 Mini-Mental    [place] Office Visit from 04/21/2016 in [place]  Total Score (max 30 points ) 27      PHQ2-9    [place] Visit from 01/27/2021 in [facility] Counselor from 12/31/2020 in [facility] Office Visit from 02/08/2017 in [place] Office Visit from 10/05/2016 in [place] Office Visit from 07/30/2016 in [place]  PHQ-2 Total Score _1 PHQ-9 Total Score _2 [place] Office Visit from 01/27/2021 in [facility] Counselor from 12/31/2020 in [place] Error: Q7 should not be populated when Q6 is No Low Risk       Assessment and Plan: Patient endorses symptoms of hypersomnia, anxiety and depression.  She notes that Prozac caused side effects and reports that she would like to discontinue it.  Today she is agreeable to start Wellbutrin XL 150 mg daily to help manage depression and she will also start hydroxyzine 10 mg 3 times daily as needed for anxiety.  1. Severe episode of recurrent major depressive disorder, without psychotic features ([place])  Start- buPROPion (WELLBUTRIN XL) 150 MG 24 hr tablet; Take 1 tablet (150 mg total) by mouth every morning.  Dispense: 30 tablet; Refill: 2  2. Generalized anxiety disorder  Start- hydrOXYzine (ATARAX/VISTARIL) 10 MG tablet; Take 1 tablet (10 mg total) by mouth 3 (three) times daily as needed.  Dispense: 90 tablet; Refill: 2  Follow-up in 3 months Follow-up with therapy  Salley Slaughter, NP 6/27/20229:33 AM

## 2021-01-28 ENCOUNTER — Other Ambulatory Visit: Payer: Self-pay

## 2021-01-31 ENCOUNTER — Other Ambulatory Visit: Payer: Self-pay

## 2021-01-31 ENCOUNTER — Ambulatory Visit (HOSPITAL_COMMUNITY): Payer: No Payment, Other | Admitting: Licensed Clinical Social Worker

## 2021-01-31 ENCOUNTER — Telehealth (HOSPITAL_COMMUNITY): Payer: Self-pay | Admitting: Licensed Clinical Social Worker

## 2021-01-31 NOTE — Telephone Encounter (Signed)
LCSW call pt at [phone] for [facility] appointment with no answer, phone went straight to VM. LCSW called again at [place] and again phone went straight to VM. Pt will be counted as a no call no show.

## 2021-02-11 ENCOUNTER — Telehealth (HOSPITAL_COMMUNITY): Payer: Self-pay | Admitting: Psychiatry

## 2021-02-11 NOTE — Telephone Encounter (Signed)
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  Advised pt per provider documentation: Provider has informed patient that she has to register her animal through The department of Health and [employer].  Therefore provider unable to complete forms. Pt voiced understanding.

## 2021-03-06 ENCOUNTER — Ambulatory Visit (HOSPITAL_COMMUNITY): Payer: No Payment, Other | Admitting: Licensed Clinical Social Worker

## 2021-03-13 ENCOUNTER — Ambulatory Visit: Payer: Self-pay | Attending: Internal Medicine | Admitting: Internal Medicine

## 2021-03-13 ENCOUNTER — Other Ambulatory Visit: Payer: Self-pay

## 2021-03-13 ENCOUNTER — Encounter: Payer: Self-pay | Admitting: Internal Medicine

## 2021-03-13 ENCOUNTER — Ambulatory Visit: Payer: Self-pay

## 2021-03-13 VITALS — Ht 64.0 in | Wt 169.0 lb

## 2021-03-13 DIAGNOSIS — F32A Depression, unspecified: Secondary | ICD-10-CM

## 2021-03-13 DIAGNOSIS — I10 Essential (primary) hypertension: Secondary | ICD-10-CM

## 2021-03-13 DIAGNOSIS — F419 Anxiety disorder, unspecified: Secondary | ICD-10-CM

## 2021-03-13 DIAGNOSIS — E119 Type 2 diabetes mellitus without complications: Secondary | ICD-10-CM

## 2021-03-13 DIAGNOSIS — E039 Hypothyroidism, unspecified: Secondary | ICD-10-CM

## 2021-03-13 MED ORDER — LEVOTHYROXINE SODIUM 75 MCG PO TABS
ORAL_TABLET | ORAL | 0 refills | Status: DC
Start: 2021-03-13 — End: 2021-06-13
  Filled 2021-03-13: qty 30, 30d supply, fill #0
  Filled 2021-03-14: qty 90, 90d supply, fill #0
  Filled 2021-03-18: qty 30, 30d supply, fill #0
  Filled 2021-03-18: qty 90, 90d supply, fill #0

## 2021-03-13 MED ORDER — GLIMEPIRIDE 4 MG PO TABS
4.0000 mg | ORAL_TABLET | Freq: Two times a day (BID) | ORAL | 0 refills | Status: DC
  Filled 2021-03-13: qty 60, 30d supply, fill #0
  Filled 2021-03-14: qty 180, 90d supply, fill #0
  Filled 2021-03-18: qty 60, 30d supply, fill #0
  Filled 2021-03-18: qty 180, 90d supply, fill #0

## 2021-03-13 MED ORDER — INSULIN GLARGINE 100 UNIT/ML ~~LOC~~ SOLN
15.0000 [IU] | Freq: Every day | SUBCUTANEOUS | 1 refills | Status: DC
  Filled 2021-03-13 – 2021-03-18 (×2): qty 10, 28d supply, fill #0
  Filled 2021-05-02: qty 10, 28d supply, fill #1

## 2021-03-13 MED ORDER — AMLODIPINE BESYLATE 10 MG PO TABS
10.0000 mg | ORAL_TABLET | Freq: Every day | ORAL | 0 refills | Status: DC
  Filled 2021-03-13: qty 30, 30d supply, fill #0
  Filled 2021-03-14: qty 90, 90d supply, fill #0
  Filled 2021-03-18: qty 30, 30d supply, fill #0
  Filled 2021-03-18: qty 90, 90d supply, fill #0

## 2021-03-13 NOTE — Progress Notes (Signed)
Patient ID: Linda George, female   DOB: September 01, 1960, 60 y.o.   MRN: [id number] Virtual Visit via Telephone Note  I connected with Linda George on 03/13/2021 at 1:39 PM by telephone and verified that I am speaking with the correct person using two identifiers  Location: Patient: home Provider: office  Participants: Myself Patient   I discussed the limitations, risks, security and privacy concerns of performing an evaluation and management service by telephone and the availability of in person appointments. I also discussed with the patient that there may be a patient responsible charge related to this service. The patient expressed understanding and agreed to proceed.   History of Present Illness: Patient with history of DM type II, HTN, HL, hypothyroidism, gad, Pt wanting to re-est care. She use to see Dr. Adrian Blackwater and was last seen here 2018.  She was in [place] for 4 yrs.  She moved back to [place] end of last yr.  Thyroid: She is on levothyroxine.  Still has Levothyroxine and taking consistently. Not feeling hot or cold all the time.  Wgh fluctuates.  Endorses constipation  DM: checking BS several times a wk.  Range before BF 160s but has not been checking ever day Was on Amaryl 4 mg BID and insulin 20 units BID.  She does not recall the name of the insulin that she was on,but out x several mths.  Sometimes she uses a friend's Lantus insulin and takes 25 units Endorses blurred vision, polyuria/dipsia.  No numbness in the extremities. Not getting in much exercise. Lacks motivation Current weight is 169 pounds.  She is 5 foot 4. Feels she can do better with eating habits. Last eye exam was 6 yrs ago  HTN:  taking only Norvasc.  Has several left.  No device to check BP,  She limits salt in foods.   Feels she gets tired easily.  No chest pains.   Working on getting [facility] discount  Depression/Anx: on bupropion and hydroxyzine.  She is plugged in with mental health  services.  She feels she is doing okay on her current medicines.  Outpatient Encounter Medications as of 03/13/2021  Medication Sig Note   amLODipine (NORVASC) 10 MG tablet Take 10 mg by mouth daily.    aspirin 81 MG tablet Take 1 tablet by mouth daily. 04/21/2016: Received from: [place]: 1 tab by mouth daily   Blood Glucose Monitoring Suppl (TRUERESULT BLOOD GLUCOSE) W/DEVICE KIT 1 each by Does not apply route 3 (three) times daily before meals.    buPROPion (WELLBUTRIN XL) 150 MG 24 hr tablet Take 1 tablet (150 mg total) by mouth every morning.    cetirizine (ZYRTEC) 10 MG tablet Take 1 tablet (10 mg total) by mouth daily.    fluticasone (FLONASE) 50 MCG/ACT nasal spray Place 2 sprays into both nostrils daily.    glimepiride (AMARYL) 4 MG tablet Take 4 mg by mouth 2 (two) times daily.    glucose blood (TRUETEST TEST) test strip 1 each by Other route 3 (three) times daily. Use as instructed    hydrOXYzine (ATARAX/VISTARIL) 10 MG tablet Take 1 tablet (10 mg total) by mouth 3 (three) times daily as needed.    levothyroxine (SYNTHROID, LEVOTHROID) 75 MCG tablet TAKE 1 TABLET BY MOUTH DAILY BEFORE BREAKFAST.    TRUEPLUS LANCETS 28G MISC 1 each by Does not apply route 3 (three) times daily.    [DISCONTINUED] atorvastatin (LIPITOR) 20 MG tablet Take 1 tablet (20 mg total)  by mouth daily.    [DISCONTINUED] FLUoxetine (PROZAC) 40 MG capsule Take 1 capsule (40 mg total) by mouth daily.    [DISCONTINUED] gabapentin (NEURONTIN) 100 MG capsule Take 2 capsules (200 mg total) by mouth 2 (two) times daily.    [DISCONTINUED] hydrochlorothiazide (HYDRODIURIL) 12.5 MG tablet Take 1 tablet (12.5 mg total) by mouth daily.    [DISCONTINUED] losartan (COZAAR) 100 MG tablet Take 1 tablet (100 mg total) by mouth daily.    [DISCONTINUED] meclizine (ANTIVERT) 25 MG tablet Take 1 tablet (25 mg total) by mouth 3 (three) times daily as needed for dizziness.    [DISCONTINUED] metFORMIN  (GLUCOPHAGE) 1000 MG tablet Take 1 tablet (1,000 mg total) by mouth 2 (two) times daily with a meal. Take by mouth 1000 mg once daily, increase to 1000 twice daily after one week    No facility-administered encounter medications on file as of 03/13/2021.    Observations/Objective: BMI is 29. No direct observation done as this was a telephone encounter.  Assessment and Plan: 1. Diabetes mellitus type 2 in nonobese (HCC) Refill given on Amaryl.  I will put her on Lantus insulin 15 units at bedtime.  Encourage patient to check blood sugars at least once a day before breakfast with goal being 90-130.  Went over signs and symptoms of hypoglycemia.  Discussed and encourage healthy eating habits.  Encouraged her to get in some form of moderate intensity exercise at least 5 days a week for 30 minutes.  Encouraged her to get eye exam when she is able to afford. - CBC; Future - Comprehensive metabolic panel; Future - Lipid panel; Future - Microalbumin / creatinine urine ratio; Future - glimepiride (AMARYL) 4 MG tablet; Take 1 tablet (4 mg total) by mouth 2 (two) times daily.  Dispense: 180 tablet; Refill: 0 - insulin glargine (LANTUS) 100 UNIT/ML injection; Inject 0.15 mLs (15 Units total) into the skin at bedtime.  Dispense: 10 mL; Refill: 1 - Hemoglobin A1c; Future  2. Essential hypertension Refill given on amlodipine.  DASH diet encouraged. - amLODipine (NORVASC) 10 MG tablet; Take 1 tablet (10 mg total) by mouth daily.  Dispense: 90 tablet; Refill: 0  3. Acquired hypothyroidism Continue levothyroxine.  She will come to the lab to have levels checked. - TSH; Future - levothyroxine (SYNTHROID) 75 MCG tablet; TAKE 1 TABLET BY MOUTH DAILY BEFORE BREAKFAST.  Dispense: 90 tablet; Refill: 0  4. Anxiety and depression Stable on current medications.  She is plugged in with mental health services.   Follow Up Instructions: 4 wks in person   I discussed the assessment and treatment plan with the  patient. The patient was provided an opportunity to ask questions and all were answered. The patient agreed with the plan and demonstrated an understanding of the instructions.   The patient was advised to call back or seek an in-person evaluation if the symptoms worsen or if the condition fails to improve as anticipated.  I  Spent 26 minutes on this telephone encounter   , MD  

## 2021-03-14 ENCOUNTER — Other Ambulatory Visit: Payer: Self-pay

## 2021-03-17 ENCOUNTER — Other Ambulatory Visit: Payer: Self-pay

## 2021-03-18 ENCOUNTER — Ambulatory Visit: Payer: Self-pay | Attending: Internal Medicine

## 2021-03-18 ENCOUNTER — Other Ambulatory Visit: Payer: Self-pay

## 2021-03-18 DIAGNOSIS — E119 Type 2 diabetes mellitus without complications: Secondary | ICD-10-CM

## 2021-03-18 DIAGNOSIS — E039 Hypothyroidism, unspecified: Secondary | ICD-10-CM

## 2021-03-19 ENCOUNTER — Other Ambulatory Visit: Payer: Self-pay

## 2021-03-19 ENCOUNTER — Telehealth: Payer: Self-pay | Admitting: Internal Medicine

## 2021-03-19 LAB — COMPREHENSIVE METABOLIC PANEL
ALT: 15 IU/L (ref 0–32)
AST: 23 IU/L (ref 0–40)
Albumin/Globulin Ratio: 1.2 (ref 1.2–2.2)
Albumin: 4.7 g/dL (ref 3.8–4.8)
Alkaline Phosphatase: 131 IU/L — ABNORMAL HIGH (ref 44–121)
BUN/Creatinine Ratio: 17 (ref 12–28)
BUN: 21 mg/dL (ref 8–27)
Bilirubin Total: 0.4 mg/dL (ref 0.0–1.2)
CO2: 22 mmol/L (ref 20–29)
Calcium: 9.6 mg/dL (ref 8.7–10.3)
Chloride: 98 mmol/L (ref 96–106)
Creatinine, Ser: 1.21 mg/dL — ABNORMAL HIGH (ref 0.57–1.00)
Globulin, Total: 3.8 g/dL (ref 1.5–4.5)
Glucose: 127 mg/dL — ABNORMAL HIGH (ref 65–99)
Potassium: 4.4 mmol/L (ref 3.5–5.2)
Sodium: 139 mmol/L (ref 134–144)
Total Protein: 8.5 g/dL (ref 6.0–8.5)
eGFR: 51 mL/min/{1.73_m2} — ABNORMAL LOW (ref >59)

## 2021-03-19 LAB — CBC
Hematocrit: 43.1 % (ref 34.0–46.6)
Hemoglobin: 14.4 g/dL (ref 11.1–15.9)
MCH: 27.9 pg (ref 26.6–33.0)
MCHC: 33.4 g/dL (ref 31.5–35.7)
MCV: 83 fL (ref 79–97)
Platelets: 346 10*3/uL (ref 150–450)
RBC: 5.17 x10E6/uL (ref 3.77–5.28)
RDW: 14 % (ref 11.7–15.4)
WBC: 6.5 10*3/uL (ref 3.4–10.8)

## 2021-03-19 LAB — MICROALBUMIN / CREATININE URINE RATIO
Creatinine, Urine: 90.6 mg/dL
Microalb/Creat Ratio: 127 mg/g creat — ABNORMAL HIGH (ref 0–29)
Microalbumin, Urine: 114.9 ug/mL

## 2021-03-19 LAB — HEMOGLOBIN A1C
Est. average glucose Bld gHb Est-mCnc: 166 mg/dL
Hgb A1c MFr Bld: 7.4 % — ABNORMAL HIGH (ref 4.8–5.6)

## 2021-03-19 LAB — LIPID PANEL
Chol/HDL Ratio: 3.6 ratio (ref 0.0–4.4)
Cholesterol, Total: 187 mg/dL (ref 100–199)
HDL: 52 mg/dL (ref >39)
LDL Chol Calc (NIH): 116 mg/dL — ABNORMAL HIGH (ref 0–99)
Triglycerides: 103 mg/dL (ref 0–149)
VLDL Cholesterol Cal: 19 mg/dL (ref 5–40)

## 2021-03-19 LAB — TSH: TSH: 7.38 u[IU]/mL — ABNORMAL HIGH (ref 0.450–4.500)

## 2021-03-19 MED ORDER — ATORVASTATIN CALCIUM 10 MG PO TABS
10.0000 mg | ORAL_TABLET | Freq: Every day | ORAL | 1 refills | Status: DC
  Filled 2021-03-19: qty 30, 30d supply, fill #0
  Filled 2021-04-14 (×2): qty 30, 30d supply, fill #1

## 2021-03-19 NOTE — Telephone Encounter (Signed)
Phone call placed to patient today to go over lab results.  I left a message on her voicemail informing her that I was calling to go over the lab results and she can reach out to [place].  Message sent to RN Carilyn Goodpasture to call pt with results. Let pt know that: A1C is 7.4 with goal being less than 7.  Continue Glimepiride and lantus insulin. Let us know if any frequent low blood sugars. Thyroid level not at goal.  Please confirm whether or not she has been taking the Levothyroxine 75 mcg consistently for the past month. If she has been skipping doses at time, please encourage to take every day. LDL cholesterol is 116 with goal being less than 70.  This increases risk for heart attacks and strokes.  I recommend starting med called Lipitor to help lower cholesterol.  Rxn sent to her pharmacy. Liver function okay. Kidney function not 100%.  This is something we will monitor over time. Try to avoid long term use of OTC pain meds like Ibuprofen, Advil, Aleve, Motrin as these can make kidney function worse.

## 2021-03-21 ENCOUNTER — Other Ambulatory Visit: Payer: Self-pay

## 2021-03-28 NOTE — Telephone Encounter (Signed)
Patient aware of results note per Dr. Wynetta Emery.

## 2021-04-09 ENCOUNTER — Telehealth: Payer: Self-pay

## 2021-04-09 NOTE — Telephone Encounter (Signed)
Pt has been scheduled and reminder has been mailed.  

## 2021-04-09 NOTE — Telephone Encounter (Signed)
-----   Message from Ladell Pier, MD sent at 03/13/2021  6:06 PM EDT ----- Give appointment in 4 weeks in person.

## 2021-04-14 ENCOUNTER — Other Ambulatory Visit: Payer: Self-pay

## 2021-04-16 ENCOUNTER — Other Ambulatory Visit: Payer: Self-pay

## 2021-04-28 ENCOUNTER — Other Ambulatory Visit: Payer: Self-pay

## 2021-04-28 ENCOUNTER — Telehealth (INDEPENDENT_AMBULATORY_CARE_PROVIDER_SITE_OTHER): Payer: No Payment, Other | Admitting: Psychiatry

## 2021-04-28 ENCOUNTER — Encounter (HOSPITAL_COMMUNITY): Payer: Self-pay | Admitting: Psychiatry

## 2021-04-28 DIAGNOSIS — F411 Generalized anxiety disorder: Secondary | ICD-10-CM

## 2021-04-28 DIAGNOSIS — F332 Major depressive disorder, recurrent severe without psychotic features: Secondary | ICD-10-CM | POA: Diagnosis not present

## 2021-04-28 MED ORDER — HYDROXYZINE HCL 10 MG PO TABS
10.0000 mg | ORAL_TABLET | Freq: Three times a day (TID) | ORAL | 3 refills | Status: AC | PRN
  Filled 2021-04-28: qty 90, 30d supply, fill #0

## 2021-04-28 MED ORDER — DULOXETINE HCL 20 MG PO CPEP
20.0000 mg | ORAL_CAPSULE | Freq: Every day | ORAL | 3 refills | Status: DC
  Filled 2021-04-28: qty 30, 30d supply, fill #0

## 2021-04-28 MED ORDER — BUPROPION HCL ER (XL) 150 MG PO TB24
150.0000 mg | ORAL_TABLET | ORAL | 3 refills | Status: DC
  Filled 2021-04-28: qty 30, 30d supply, fill #0

## 2021-04-28 NOTE — Progress Notes (Signed)
[place] MD/PA/NP OP Progress Note Virtual Visit via Video Note  I connected with Linda George on 04/28/21 at  1:30 PM EDT by a video enabled telemedicine application and verified that I am speaking with the correct person using two identifiers.  Location: Patient: Home Provider: Clinic   I discussed the limitations of evaluation and management by telemedicine and the availability of in person appointments. The patient expressed understanding and agreed to proceed.  I provided 30 minutes of non-face-to-face time during this encounter.   04/28/2021 2:01 PM Linda George  MRN:  [id number]  Chief Complaint: "I think the medications have helped a little bit but I still have lack of energy and motivation"  HPI: 60 year old female seen today for follow psychiatric evaluation.   She has a psychiatric history of anxiety, depression, and PTSD.  Currently she is Wellbutrin XL 150 mg and hydroxyzine 10 mg three times daily as needed. She notes that her medications are somewhat effective in managing her psychiatric conditions.     Today she is pleasant, cooperative, and engaged in conversation.  Patient speech is slow and her voice tone is decreased.  Patient notes that Wellbutrin and hydroxyzine were somewhat effective however notes that at times she lacks energy and motivation. She did Artist that she becomes less overwhelmed and is able to better manage her emotions. She notes that she does not have crying spells anymore and notes her anxiety and depression has somewhat improved. Today provider conducted a GAD-7 and patient scored an 11, at her last visit she scored an 70.  Provider also conducted a PHQ-9 and patient scored a 20, at her last visit she scored a 24.  She endorses poor appetite.  Patient notes that she sleeps approximately 12 hours nightly. She notes hydroxyzine sedates her so she take it infrequently.  Patient notes that a source of her stress deals with her housing.  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 She notes that her landlord told her that she is violating her leases and may evict her. She informed Probation officer that she needs proof that this animal is helping her and she noted that she sent forms over to be reviewed and signed. Provider informed patient that she was not qualified to register her animal but would review the forms and sign then if possible. She endorsed understanding and agrees.    Patient notes that she has been having back pain while standing. She notes that she has not followed up with her PCP in a while. Provider recommended patient seeing her PCP. She endorsed understanding and agreed.   Today she is agreeable to starting Cymbalta 20 mg daily to help manage anxiety, depression, and pain. Potential side effects of medication and risks vs benefits of treatment vs non-treatment were explained and discussed. All questions were answered. She will continue all other medications as prescribed.  She will follow-up with outpatient counseling for therapy.  No other concerns at this time. Visit Diagnosis:    ICD-10-CM   1. Severe episode of recurrent major depressive disorder, without psychotic features ([place])  F33.2 buPROPion (WELLBUTRIN XL) 150 MG 24 hr tablet    2. Generalized anxiety disorder  F41.1 hydrOXYzine (ATARAX/VISTARIL) 10 MG tablet      Past Psychiatric History: anxiety, depression, and PTSD  Past Medical History:  Past Medical History:  Diagnosis Date   Allergy 20 years   seasonal   Depression 20 years   Diabetes mellitus without complication (  [place]) 10 years   Hypertension 10 years   Hypothyroidism 7 years   hypo    Past Surgical History:  Procedure Laterality Date   CESAREAN SECTION     3 of them   COLONOSCOPY WITH PROPOFOL N/A 06/11/2015   Procedure: COLONOSCOPY WITH PROPOFOL;  Surgeon: Garlan Fair, MD;  Location: [facility];  Service: Endoscopy;  Laterality: N/A;    Family  Psychiatric History: Daughter ADHD and depression  Family History:  Family History  Problem Relation Age of Onset   Diabetes Mother    Heart disease Mother    Hypertension Mother    Cancer Mother    Diabetes Father    Heart disease Father    Hypertension Father     Social History:  Social History   Socioeconomic History   Marital status: Single    Spouse name: Not on file   Number of children: Not on file   Years of education: Not on file   Highest education level: Not on file  Occupational History   Not on file  Tobacco Use   Smoking status: Never   Smokeless tobacco: Never  Substance and Sexual Activity   Alcohol use: No   Drug use: No   Sexual activity: Never  Other Topics Concern   Not on file  Social History Narrative   Not on file   Social Determinants of Health   Financial Resource Strain: High Risk   Difficulty of Paying Living Expenses: Very hard  Food Insecurity: No Food Insecurity   Worried About Charity fundraiser in the Last Year: Never true   Ran Out of Food in the Last Year: Never true  Transportation Needs: Unmet Transportation Needs   Lack of Transportation (Medical): No   Lack of Transportation (Non-Medical): Yes  Physical Activity: Inactive   Days of Exercise per Week: 0 days   Minutes of Exercise per Session: 0 min  Stress: Stress Concern Present   Feeling of Stress : Very much  Social Connections: Socially Isolated   Frequency of Communication with Friends and Family: Once a week   Frequency of Social Gatherings with Friends and Family: Once a week   Attends Religious Services: More than 4 times per year   Active Member of [employer] or Organizations: No   Attends Archivist Meetings: Never   Marital Status: Divorced    Allergies:  Allergies  Allergen Reactions   Ace Inhibitors Swelling    Metabolic Disorder Labs: Lab Results  Component Value Date   HGBA1C 7.4 (H) 03/18/2021   No results found for: PROLACTIN Lab  Results  Component Value Date   CHOL 187 03/18/2021   TRIG 103 03/18/2021   HDL 52 03/18/2021   CHOLHDL 3.6 03/18/2021   VLDL 24 10/26/2014   LDLCALC 116 (H) 03/18/2021   LDLCALC 108 (H) 02/08/2017   Lab Results  Component Value Date   TSH 7.380 (H) 03/18/2021   TSH 8.990 (H) 02/08/2017    Therapeutic Level Labs: No results found for: LITHIUM No results found for: VALPROATE No components found for:  CBMZ  Current Medications: Current Outpatient Medications  Medication Sig Dispense Refill   DULoxetine (CYMBALTA) 20 MG capsule Take 1 capsule (20 mg total) by mouth daily. 30 capsule 3   amLODipine (NORVASC) 10 MG tablet Take 1 tablet (10 mg total) by mouth daily. 90 tablet 0   aspirin 81 MG tablet Take 1 tablet by mouth daily.     atorvastatin (LIPITOR) 10 MG  tablet Take 1 tablet (10 mg total) by mouth daily. 30 tablet 1   Blood Glucose Monitoring Suppl (TRUERESULT BLOOD GLUCOSE) W/DEVICE KIT 1 each by Does not apply route 3 (three) times daily before meals. 1 each 0   buPROPion (WELLBUTRIN XL) 150 MG 24 hr tablet Take 1 tablet (150 mg total) by mouth every morning. 30 tablet 3   cetirizine (ZYRTEC) 10 MG tablet Take 1 tablet (10 mg total) by mouth daily. 90 tablet 0   fluticasone (FLONASE) 50 MCG/ACT nasal spray Place 2 sprays into both nostrils daily. 48 g 0   glimepiride (AMARYL) 4 MG tablet Take 1 tablet (4 mg total) by mouth 2 (two) times daily. 180 tablet 0   glucose blood (TRUETEST TEST) test strip 1 each by Other route 3 (three) times daily. Use as instructed 300 each 0   hydrOXYzine (ATARAX/VISTARIL) 10 MG tablet Take 1 tablet (10 mg total) by mouth 3 (three) times daily as needed. 90 tablet 3   insulin glargine (LANTUS) 100 UNIT/ML injection Inject 0.15 mLs (15 Units total) into the skin at bedtime. 10 mL 1   levothyroxine (SYNTHROID) 75 MCG tablet TAKE 1 TABLET BY MOUTH DAILY BEFORE BREAKFAST. 90 tablet 0   TRUEPLUS LANCETS 28G MISC 1 each by Does not apply route 3  (three) times daily. 100 each 12   No current facility-administered medications for this visit.     Musculoskeletal: Strength & Muscle Tone:  Unable to assess due to telehealth visit, video camera turned off Gait & Station:  Unable to assess due to telehealth visit, video camera turned off Patient leans: N/A  Psychiatric Specialty Exam: Review of Systems  There were no vitals taken for this visit.There is no height or weight on file to calculate BMI.  General Appearance:  Unable to assess due to telehealth visit, video camera turned off  Eye Contact:   Unable to assess due to telehealth visit, video camera turned off  Speech:  Clear and Coherent and Slow  Volume:  Decreased  Mood:  Anxious and Depressed  Affect:  Appropriate and Non-Congruent  Thought Process:  Coherent, Goal Directed, and Linear  Orientation:  Full (Time, Place, and Person)  Thought Content: WDL and Logical   Suicidal Thoughts:  No  Homicidal Thoughts:  No  Memory:  Immediate;   Good Recent;   Good Remote;   Good  Judgement:  GoodUnable to assess due to telehealth visit, video camera turned off  Insight:  Good  Psychomotor Activity:   Unable to assess due to telehealth visit, video camera turned off  Concentration:  Concentration: Good and Attention Span: Good  Recall:  Good  Fund of Knowledge: Good  Language: Good  Akathisia:   Unable to assess due to telehealth visit, video camera turned off  Handed:  Right  AIMS (if indicated): Unable to assess due to telehealth visit, video camera turned off  Assets:  Communication Skills Desire for Improvement Housing Leisure Time Physical Health Resilience  ADL's:  Intact  Cognition: WNL  Sleep:  Fair   Screenings: GAD-7    Flowsheet Row Video Visit from 04/28/2021 in [facility] Office Visit from 01/27/2021 in [facility] Office Visit from 02/08/2017 in [place] Office  Visit from 10/05/2016 in [place] Office Visit from 07/30/2016 in [place]  Total GAD-7 Score _0 Mini-Mental  [place] Office Visit from 04/21/2016 in [place]  Total Score (max 30 points ) 27      PHQ2-9    Flowsheet Row Video Visit from 04/28/2021 in [facility] Office Visit from 01/27/2021 in [facility] Counselor from 12/31/2020 in [facility] Office Visit from 02/08/2017 in [place] Office Visit from 10/05/2016 in [place]  PHQ-2 Total Score _0 PHQ-9 Total Score _1 Flowsheet Row Office Visit from 01/27/2021 in [facility] Counselor from 12/31/2020 in [place] Error: Q7 should not be populated when Q6 is No Low Risk        Assessment and Plan: Patient notes that her anxiety and depression has somewhat improved however notes that at times she has little energy or motivation. She also notes that recently she has been in more pain. Today she is agreeable to starting Cymbalta 20 mg to help manage anxiety, depression, and pain. She will continue all other medications as prescribed. Patient also concerned about her housing as she is in violation of her lease by having a dog. She has not registered her animal and request provider fill out forms. Provider informed patient that she was not qualified to register her animal but would review forms and sign them if possible. She endorsed understanding and agreed.   1. Severe episode of recurrent major depressive disorder, without psychotic features ([place])  Start- DULoxetine (CYMBALTA) 20 MG capsule; Take 1 capsule (20 mg total) by mouth daily.  Dispense: 30 capsule; Refill:  3 Continue- buPROPion (WELLBUTRIN XL) 150 MG 24 hr tablet; Take 1 tablet (150 mg total) by mouth every morning.  Dispense: 30 tablet; Refill: 3  2. Generalized anxiety disorder  Start- DULoxetine (CYMBALTA) 20 MG capsule; Take 1 capsule (20 mg total) by mouth daily.  Dispense: 30 capsule; Refill: 3 Continue- hydrOXYzine (ATARAX/VISTARIL) 10 MG tablet; Take 1 tablet (10 mg total) by mouth 3 (three) times daily as needed.  Dispense: 90 tablet; Refill: 3   Salley Slaughter, NP 04/28/2021, 2:01 PM

## 2021-04-29 ENCOUNTER — Encounter (HOSPITAL_COMMUNITY): Payer: Self-pay | Admitting: Psychiatry

## 2021-04-29 ENCOUNTER — Other Ambulatory Visit: Payer: Self-pay

## 2021-05-02 ENCOUNTER — Other Ambulatory Visit: Payer: Self-pay

## 2021-05-06 ENCOUNTER — Other Ambulatory Visit: Payer: Self-pay

## 2021-05-09 ENCOUNTER — Ambulatory Visit: Payer: Self-pay | Admitting: Internal Medicine

## 2021-05-14 ENCOUNTER — Telehealth: Payer: Self-pay | Admitting: *Deleted

## 2021-05-14 DIAGNOSIS — K029 Dental caries, unspecified: Secondary | ICD-10-CM

## 2021-05-14 NOTE — Telephone Encounter (Signed)
Copied from [place] [id number]. Topic: Referral - Request for Referral >> May 09, 2021  8:50 AM Scherrie Gerlach wrote: Has patient seen PCP for this complaint? No. *If NO, is insurance requiring patient see PCP for this issue before PCP can refer them? Referral for which specialty: dentist Preferred provider/office: any Reason for referral: two of her teeth have broken/started to bother her/been a long time since she she saw a dentist

## 2021-05-16 NOTE — Telephone Encounter (Signed)
Will forward to provider to place referral  

## 2021-05-16 NOTE — Addendum Note (Signed)
Addended by: Karle Plumber B on: 05/16/2021 01:10 PM   Modules accepted: Orders

## 2021-05-21 ENCOUNTER — Other Ambulatory Visit: Payer: Self-pay

## 2021-06-03 ENCOUNTER — Other Ambulatory Visit: Payer: Self-pay

## 2021-06-11 ENCOUNTER — Other Ambulatory Visit: Payer: Self-pay

## 2021-06-13 ENCOUNTER — Other Ambulatory Visit: Payer: Self-pay

## 2021-06-13 ENCOUNTER — Other Ambulatory Visit: Payer: Self-pay | Admitting: Internal Medicine

## 2021-06-13 DIAGNOSIS — E039 Hypothyroidism, unspecified: Secondary | ICD-10-CM

## 2021-06-13 DIAGNOSIS — E119 Type 2 diabetes mellitus without complications: Secondary | ICD-10-CM

## 2021-06-13 DIAGNOSIS — I10 Essential (primary) hypertension: Secondary | ICD-10-CM

## 2021-06-13 MED ORDER — AMLODIPINE BESYLATE 10 MG PO TABS
10.0000 mg | ORAL_TABLET | Freq: Every day | ORAL | 0 refills | Status: DC
  Filled 2021-06-13: qty 90, 90d supply, fill #0

## 2021-06-13 MED ORDER — GLIMEPIRIDE 4 MG PO TABS
4.0000 mg | ORAL_TABLET | Freq: Two times a day (BID) | ORAL | 0 refills | Status: DC
  Filled 2021-06-13: qty 180, 90d supply, fill #0

## 2021-06-13 MED ORDER — LEVOTHYROXINE SODIUM 75 MCG PO TABS
ORAL_TABLET | ORAL | 0 refills | Status: DC
  Filled 2021-06-13: qty 90, 90d supply, fill #0

## 2021-06-13 NOTE — Telephone Encounter (Signed)
Requested Prescriptions  Pending Prescriptions Disp Refills  . glimepiride (AMARYL) 4 MG tablet 180 tablet 0    Sig: Take 1 tablet (4 mg total) by mouth 2 (two) times daily.     Endocrinology:  Diabetes - Sulfonylureas Passed - 06/13/2021 11:01 AM      Passed - HBA1C is between 0 and 7.9 and within 180 days    Hgb A1c MFr Bld  Date Value Ref Range Status  03/18/2021 7.4 (H) 4.8 - 5.6 % Final    Comment:             Prediabetes: 5.7 - 6.4          Diabetes: >6.4          Glycemic control for adults with diabetes: <7.0          Passed - Valid encounter within last 6 months    Recent Outpatient Visits          3 months ago Diabetes mellitus type 2 in nonobese [facility])   [facility] Ladell Pier, MD   4 years ago Diabetes mellitus without complication [facility])   [facility] [facility] Boykin Nearing, MD   4 years ago Diabetes mellitus without complication [facility])   [place] Boykin Nearing, MD   4 years ago Diabetes mellitus without complication [facility])   [place] Gross, [place], MD   5 years ago Diabetes mellitus without complication [facility])   [place] Boykin Nearing, MD      Future Appointments            In 2 weeks Wynetta Emery, Dalbert Batman, MD [place]           . amLODipine (NORVASC) 10 MG tablet 90 tablet 0    Sig: Take 1 tablet (10 mg total) by mouth daily.     Cardiovascular:  Calcium Channel Blockers Failed - 06/13/2021 11:01 AM      Failed - Last BP in normal range    BP Readings from Last 1 Encounters:  02/08/17 (!) 161/92         Passed - Valid encounter within last 6 months    Recent Outpatient Visits          3 months ago Diabetes mellitus type 2 in nonobese [facility])   [place] Ladell Pier, MD   4 years ago Diabetes mellitus without  complication [facility])   [place] [facility] Boykin Nearing, MD   4 years ago Diabetes mellitus without complication [facility])   [place] Boykin Nearing, MD   4 years ago Diabetes mellitus without complication [facility])   [place] Boykin Nearing, MD   5 years ago Diabetes mellitus without complication [facility])   [place] Boykin Nearing, MD      Future Appointments            In 2 weeks Wynetta Emery, Dalbert Batman, MD [place]           . levothyroxine (SYNTHROID) 75 MCG tablet 90 tablet 0    Sig: TAKE 1 TABLET BY MOUTH DAILY BEFORE BREAKFAST.     Endocrinology:  Hypothyroid Agents Failed - 06/13/2021 11:01 AM      Failed - TSH needs to be rechecked within 3  months after an abnormal result. Refill until TSH is due.      Failed - TSH in normal range and within 360 days    TSH  Date Value Ref Range Status  03/18/2021 7.380 (H) 0.450 - 4.500 uIU/mL Final         Passed - Valid encounter within last 12 months    Recent Outpatient Visits          3 months ago Diabetes mellitus type 2 in nonobese [facility])   [place] [facility] Ladell Pier, MD   4 years ago Diabetes mellitus without complication [facility])   [place] [facility] Boykin Nearing, MD   4 years ago Diabetes mellitus without complication [facility])   [place] Boykin Nearing, MD   4 years ago Diabetes mellitus without complication [facility])   [place] Boykin Nearing, MD   5 years ago Diabetes mellitus without complication [facility])   [place] Boykin Nearing, MD      Future Appointments            In 2 weeks Wynetta Emery, Dalbert Batman, MD [place]

## 2021-06-16 ENCOUNTER — Other Ambulatory Visit: Payer: Self-pay

## 2021-06-30 ENCOUNTER — Ambulatory Visit: Payer: Self-pay | Admitting: Internal Medicine

## 2021-07-02 ENCOUNTER — Ambulatory Visit: Payer: Self-pay | Attending: Internal Medicine | Admitting: Internal Medicine

## 2021-07-02 ENCOUNTER — Other Ambulatory Visit: Payer: Self-pay

## 2021-07-02 DIAGNOSIS — L309 Dermatitis, unspecified: Secondary | ICD-10-CM

## 2021-07-02 DIAGNOSIS — E039 Hypothyroidism, unspecified: Secondary | ICD-10-CM

## 2021-07-02 DIAGNOSIS — R400 Somnolence: Secondary | ICD-10-CM

## 2021-07-02 DIAGNOSIS — R252 Cramp and spasm: Secondary | ICD-10-CM

## 2021-07-02 DIAGNOSIS — R5383 Other fatigue: Secondary | ICD-10-CM

## 2021-07-02 DIAGNOSIS — Z794 Long term (current) use of insulin: Secondary | ICD-10-CM

## 2021-07-02 DIAGNOSIS — E1165 Type 2 diabetes mellitus with hyperglycemia: Secondary | ICD-10-CM

## 2021-07-02 DIAGNOSIS — F32A Depression, unspecified: Secondary | ICD-10-CM

## 2021-07-02 NOTE — Progress Notes (Signed)
Patient ID: Linda George, female   DOB: [birth year], 60 y.o.   MRN: [id number] Virtual Visit via Telephone Note  I connected with [name] on 07/02/2021 at 2:45 PM by telephone and verified that I am speaking with the correct person using two identifiers  Location: Patient: home Provider: office  Participants: Myself Patient   I discussed the limitations, risks, security and privacy concerns of performing an evaluation and management service by telephone and the availability of in person appointments. I also discussed with the patient that there may be a patient responsible charge related to this service. The patient expressed understanding and agreed to proceed.   History of Present Illness: Patient with history of DM type II, HTN, HL, hypothyroidism, gad.  Last eval 03/2021.  Pt reports she was unable to come in person because of lack of transportation.  Pt c/o getting leg cramps every day in the evening and at nights x several mths. Cramps sometimes when walking but mostly at rest.  Legs sensitive to touch. Legs get tired easily.Prescribed Lipitor in 03/2021 after LDL came back elev.  She took it for 1 mth and did not have refill Also has noted rash intermittently on legs  C/o fatigue.  Can sleep 10-12 hrs at nights but does not wake feeling refresh. Lack motivation and energy to do things.  Has problems focusing on tasks.  Still followed by [facility].  Sees her via telemedicine Q 3 mths.  On Cymbalta 20 mg, Hydroxyzine.  Pt states Buproprion was d/c but I still see on list and according to mental health providers note on 04/2021, bupropion was to be continued..  She feels the Cymbalta and Hydroxyzine contributes to her feeling fatigue.  She felt she did better with bupropion. Snorns some.  Sometimes she has morning HA.  Falls asleep during the day Reports that she suffered from chronic depression and fatigue for 5 yrs.  Has applied for disability based on it but was denied.   She is appealing the decision. Reports compliance with levothyroxine.  Last TSH level was slightly elevated.  DM:  self increase Lantus from 15 units to 40 units after last telephone visit with me because her blood sugars were still running high in the 300s.  She checks blood sugars once a day at bedtime.  Reports blood sugars are always over 200.  She is not sure whether she is checking 2 hours after supper or not.   Last A1c in August was 7.4.  She is also on glimepiride. Outpatient Encounter Medications as of 07/02/2021  Medication Sig Note   amLODipine (NORVASC) 10 MG tablet Take 1 tablet (10 mg total) by mouth daily.    aspirin 81 MG tablet Take 1 tablet by mouth daily. 04/21/2016: Received from: [facility]: 1 tab by mouth daily   atorvastatin (LIPITOR) 10 MG tablet Take 1 tablet (10 mg total) by mouth daily.    Blood Glucose Monitoring Suppl (TRUERESULT BLOOD GLUCOSE) W/DEVICE KIT 1 each by Does not apply route 3 (three) times daily before meals.    buPROPion (WELLBUTRIN XL) 150 MG 24 hr tablet Take 1 tablet (150 mg total) by mouth every morning.    cetirizine (ZYRTEC) 10 MG tablet Take 1 tablet (10 mg total) by mouth daily.    DULoxetine (CYMBALTA) 20 MG capsule Take 1 capsule (20 mg total) by mouth daily.    fluticasone (FLONASE) 50 MCG/ACT nasal spray Place 2 sprays into both nostrils daily.  glimepiride (AMARYL) 4 MG tablet Take 1 tablet (4 mg total) by mouth 2 (two) times daily.    glucose blood (TRUETEST TEST) test strip 1 each by Other route 3 (three) times daily. Use as instructed    hydrOXYzine (ATARAX/VISTARIL) 10 MG tablet Take 1 tablet (10 mg total) by mouth 3 (three) times daily as needed.    insulin glargine (LANTUS) 100 UNIT/ML injection Inject 0.15 mLs (15 Units total) into the skin at bedtime.    levothyroxine (SYNTHROID) 75 MCG tablet TAKE 1 TABLET BY MOUTH DAILY BEFORE BREAKFAST.    TRUEPLUS LANCETS 28G MISC 1 each by Does not apply route 3  (three) times daily.    No facility-administered encounter medications on file as of 07/02/2021.      Observations/Objective: No direct observation done as this was a telephone encounter.  Patient is somewhat of a poor historian and talks in a very faint voice.  And constantly asking her to repeat  Assessment and Plan: 1. Leg cramps Inform patient that atorvastatin can cause cramps.  However it sounds as though she has been out of the atorvastatin for about 6 to 8 weeks.  So I doubt that is causing the ongoing cramps.  Does not sound like claudication symptoms but I would like to do an in person evaluation to check her pulses.  I recommend checking chemistry and a CK level.  I will hold off on refilling the atorvastatin for now. - CK; Future - Basic Metabolic Panel; Future  2. Fatigue due to depression Patient reports history of chronic fatigue and chronic depression.  She is trying to get disability based on this.  I told her that I would recommend she speaks with her mental health provider to determine whether she thinks her mental health issues raised to the level of being disabled.  Also recommend that she clarify with her mental health provider whether or not she is supposed to be on the bupropion and inform her of her concern that Cymbalta causes increased fatigue.  3. Dermatitis Recommend in person evaluation for this.  4. Type 2 diabetes mellitus with hyperglycemia, with long-term current use of insulin (HCC) Recommend checking blood sugars before breakfast and 2 hours after dinner.  Keep a log of the readings and bring them in on her next visit which I would like to be in person.  We will schedule her about 4 to 6 weeks out which gives her time to arrange transportation.  5. Daytime sleepiness Sounds like most of her issues of fatigue and daytime sleepiness are related to depression and her mental health medications.  However given the chronic nature and the daytime sleepiness, I  think we should also screen for sleep apnea. - PSG Sleep Study; Future  6. Acquired hypothyroidism - TSH; Future   Follow Up Instructions: 6 wks   I discussed the assessment and treatment plan with the patient. The patient was provided an opportunity to ask questions and all were answered. The patient agreed with the plan and demonstrated an understanding of the instructions.   The patient was advised to call back or seek an in-person evaluation if the symptoms worsen or if the condition fails to improve as anticipated.  I  Spent 22 minutes on this telephone encounter  Karle Plumber, MD

## 2021-07-21 ENCOUNTER — Other Ambulatory Visit: Payer: Self-pay

## 2021-07-30 ENCOUNTER — Telehealth (INDEPENDENT_AMBULATORY_CARE_PROVIDER_SITE_OTHER): Payer: No Payment, Other | Admitting: Psychiatry

## 2021-07-30 ENCOUNTER — Other Ambulatory Visit: Payer: Self-pay

## 2021-07-30 ENCOUNTER — Encounter (HOSPITAL_COMMUNITY): Payer: Self-pay | Admitting: Psychiatry

## 2021-07-30 DIAGNOSIS — F332 Major depressive disorder, recurrent severe without psychotic features: Secondary | ICD-10-CM | POA: Diagnosis not present

## 2021-07-30 MED ORDER — BUPROPION HCL ER (XL) 150 MG PO TB24
150.0000 mg | ORAL_TABLET | ORAL | 3 refills | Status: DC
  Filled 2021-07-30 – 2021-09-02 (×2): qty 30, 30d supply, fill #0
  Filled 2021-09-02 – 2021-09-29 (×2): qty 30, 30d supply, fill #1

## 2021-07-30 NOTE — Progress Notes (Signed)
[place] MD/PA/NP OP Progress Note   Virtual Visit via Telephone Note  I connected with Cozette Braggs on 07/30/21 at  3:30 PM EST by telephone and verified that I am speaking with the correct person using two identifiers.  Location: Patient: home Provider: Clinic   I discussed the limitations, risks, security and privacy concerns of performing an evaluation and management service by telephone and the availability of in person appointments. I also discussed with the patient that there may be a patient responsible charge related to this service. The patient expressed understanding and agreed to proceed.   I provided 30 minutes of non-face-to-face time during this encounter.    07/30/2021 12:39 PM Sharesa Kemp  MRN:  [id number]  Chief Complaint: "I have guilty feelings about my children that make me feel depressed"  HPI: 60 year old female seen today for follow psychiatric evaluation.   She has a psychiatric history of anxiety, depression, and PTSD.  Currently she is Cymbalta 20 mg daily, Wellbutrin XL 150 mg and hydroxyzine 10 mg three times daily as needed. She notes that she has not taken her medications in over two months.   Today she is pleasant, cooperative, and engaged in conversation.  Patient speech is slow and her voice tone is decreased.  Patient notes that recently she has been feeling more depressed because of guilt she holds from her children. She notes that she discontinued Cymbalta because it caused her to feel weak and tired. She notes that she ran out of Wellbutrin and notes that she would like to restart it to help improve her mental health. Today provider conducted a GAD-7 and patient scored a 9, at her last visit she scored an 11.  Provider also conducted a PHQ-9 and patient scored a 21, at her last visit she scored a 20.  She endorses poor appetite noting that she eats junk food and has gained approximately 10 pounds since her last visit.  Patient notes that  she sleeps approximately 10 hours nightly. She notes hydroxyzine sedates her so she take it infrequently.   Patient notes that she continues to have in her legs and back pain while standing. She notes that she will follow up with her PCP in January if she can secure transportation.   At this time patient would like to restart Wellbutrin XL 150 mg. She reports that she does not need a refill on hydroxyzine as she uses it infrequently. She does not want to restart Cymbalta.  She will follow-up with outpatient counseling for therapy.  No other concerns at this time. Visit Diagnosis:    ICD-10-CM   1. Severe episode of recurrent major depressive disorder, without psychotic features ([place])  F33.2 buPROPion (WELLBUTRIN XL) 150 MG 24 hr tablet      Past Psychiatric History: anxiety, depression, and PTSD  Past Medical History:  Past Medical History:  Diagnosis Date   Allergy 20 years   seasonal   Depression 20 years   Diabetes mellitus without complication ([place]) 10 years   Hypertension 10 years   Hypothyroidism 7 years   hypo    Past Surgical History:  Procedure Laterality Date   CESAREAN SECTION     3 of them   COLONOSCOPY WITH PROPOFOL N/A 06/11/2015   Procedure: COLONOSCOPY WITH PROPOFOL;  Surgeon: Garlan Fair, MD;  Location: [facility];  Service: Endoscopy;  Laterality: N/A;    Family Psychiatric History: Daughter ADHD and depression  Family History:  Family History  Problem Relation Age of  Onset   Diabetes Mother    Heart disease Mother    Hypertension Mother    Cancer Mother    Diabetes Father    Heart disease Father    Hypertension Father     Social History:  Social History   Socioeconomic History   Marital status: Divorced    Spouse name: Not on file   Number of children: Not on file   Years of education: Not on file   Highest education level: Not on file  Occupational History   Not on file  Tobacco Use   Smoking status: Never   Smokeless tobacco: Never   Substance and Sexual Activity   Alcohol use: No   Drug use: No   Sexual activity: Never  Other Topics Concern   Not on file  Social History Narrative   Not on file   Social Determinants of Health   Financial Resource Strain: High Risk   Difficulty of Paying Living Expenses: Very hard  Food Insecurity: No Food Insecurity   Worried About Charity fundraiser in the Last Year: Never true   Ran Out of Food in the Last Year: Never true  Transportation Needs: Unmet Transportation Needs   Lack of Transportation (Medical): No   Lack of Transportation (Non-Medical): Yes  Physical Activity: Inactive   Days of Exercise per Week: 0 days   Minutes of Exercise per Session: 0 min  Stress: Stress Concern Present   Feeling of Stress : Very much  Social Connections: Socially Isolated   Frequency of Communication with Friends and Family: Once a week   Frequency of Social Gatherings with Friends and Family: Once a week   Attends Religious Services: More than 4 times per year   Active Member of [employer] or Organizations: No   Attends Archivist Meetings: Never   Marital Status: Divorced    Allergies:  Allergies  Allergen Reactions   Ace Inhibitors Swelling    Metabolic Disorder Labs: Lab Results  Component Value Date   HGBA1C 7.4 (H) 03/18/2021   No results found for: PROLACTIN Lab Results  Component Value Date   CHOL 187 03/18/2021   TRIG 103 03/18/2021   HDL 52 03/18/2021   CHOLHDL 3.6 03/18/2021   VLDL 24 10/26/2014   LDLCALC 116 (H) 03/18/2021   LDLCALC 108 (H) 02/08/2017   Lab Results  Component Value Date   TSH 7.380 (H) 03/18/2021   TSH 8.990 (H) 02/08/2017    Therapeutic Level Labs: No results found for: LITHIUM No results found for: VALPROATE No components found for:  CBMZ  Current Medications: Current Outpatient Medications  Medication Sig Dispense Refill   amLODipine (NORVASC) 10 MG tablet Take 1 tablet (10 mg total) by mouth daily. 90 tablet 0    aspirin 81 MG tablet Take 1 tablet by mouth daily.     atorvastatin (LIPITOR) 10 MG tablet Take 1 tablet (10 mg total) by mouth daily. 30 tablet 1   Blood Glucose Monitoring Suppl (TRUERESULT BLOOD GLUCOSE) W/DEVICE KIT 1 each by Does not apply route 3 (three) times daily before meals. 1 each 0   buPROPion (WELLBUTRIN XL) 150 MG 24 hr tablet Take 1 tablet (150 mg total) by mouth every morning. 30 tablet 3   cetirizine (ZYRTEC) 10 MG tablet Take 1 tablet (10 mg total) by mouth daily. 90 tablet 0   fluticasone (FLONASE) 50 MCG/ACT nasal spray Place 2 sprays into both nostrils daily. 48 g 0   glimepiride (AMARYL) 4 MG  tablet Take 1 tablet (4 mg total) by mouth 2 (two) times daily. 180 tablet 0   glucose blood (TRUETEST TEST) test strip 1 each by Other route 3 (three) times daily. Use as instructed 300 each 0   hydrOXYzine (ATARAX/VISTARIL) 10 MG tablet Take 1 tablet (10 mg total) by mouth 3 (three) times daily as needed. 90 tablet 3   insulin glargine (LANTUS) 100 UNIT/ML injection Inject 0.15 mLs (15 Units total) into the skin at bedtime. 10 mL 1   levothyroxine (SYNTHROID) 75 MCG tablet TAKE 1 TABLET BY MOUTH DAILY BEFORE BREAKFAST. 90 tablet 0   TRUEPLUS LANCETS 28G MISC 1 each by Does not apply route 3 (three) times daily. 100 each 12   No current facility-administered medications for this visit.     Musculoskeletal: Strength & Muscle Tone:  Unable to assess due to telephone visit Gait & Station:  Unable to assess due to telephone Patient leans: N/A  Psychiatric Specialty Exam: Review of Systems  There were no vitals taken for this visit.There is no height or weight on file to calculate BMI.  General Appearance:  Unable to assess due to telephone visit  Eye Contact:   Unable to assess due to telephone visit  Speech:  Clear and Coherent and Slow  Volume:  Decreased  Mood:  Anxious and Depressed  Affect:  Appropriate and Non-Congruent  Thought Process:  Coherent, Goal Directed, and  Linear  Orientation:  Full (Time, Place, and Person)  Thought Content: WDL and Logical   Suicidal Thoughts:  No  Homicidal Thoughts:  No  Memory:  Immediate;   Good Recent;   Good Remote;   Good  Judgement:  Good  Insight:  Good  Psychomotor Activity:   Unable to assess due to telephone  Concentration:  Concentration: Good and Attention Span: Good  Recall:  Good  Fund of Knowledge: Good  Language: Good  Akathisia:   Unable to assess due to telephone visit  Handed:  Right  AIMS (if indicated): Unable to assess due to telephone visit  Assets:  Communication Skills Desire for Improvement Housing Leisure Time Physical Health Resilience  ADL's:  Intact  Cognition: WNL  Sleep:  Fair   Screenings: GAD-7    Flowsheet Row Video Visit from 07/30/2021 in [facility] Video Visit from 04/28/2021 in [facility] Office Visit from 01/27/2021 in [facility] Office Visit from 02/08/2017 in [place] Office Visit from 10/05/2016 in [place]  Total GAD-7 Score 9 11 11 4 3       [place] Office Visit from 04/21/2016 in [place]  Total Score (max 30 points ) 27      PHQ2-9    Flowsheet Row Video Visit from 07/30/2021 in [facility] Video Visit from 04/28/2021 in [facility] Office Visit from 01/27/2021 in [facility] Counselor from 12/31/2020 in [facility] Office Visit from 02/08/2017 in [place]  PHQ-2 Total Score 5 6 6 4 4   PHQ-9 Total Score 21 20 24 20 17       [place] Visit from 01/27/2021 in [facility] Counselor from 12/31/2020 in [place] Error: Q7  should not be populated when Q6 is No Low Risk  Assessment and Plan: Patient notes that her anxiety and depression. She notes Cymbalta made her weak and does not want to restart it. At this time patient would like to restart Wellbutrin XL 150 mg. She reports that she does not need a refill on hydroxyzine as she uses it infrequently.   1. Severe episode of recurrent major depressive disorder, without psychotic features ([place])  Restart- buPROPion (WELLBUTRIN XL) 150 MG 24 hr tablet; Take 1 tablet (150 mg total) by mouth every morning.  Dispense: 30 tablet; Refill: 3  Salley Slaughter, NP 07/30/2021, 12:39 PM

## 2021-08-14 ENCOUNTER — Telehealth: Payer: Self-pay | Admitting: Internal Medicine

## 2021-08-14 NOTE — Telephone Encounter (Signed)
I return Pt call, her CAFA and OC program exp 09/18/21, unable to LVM no asnwer

## 2021-08-14 NOTE — Telephone Encounter (Signed)
Copied from [place] [id number]. Topic: General - Other >> Nov 14, 2020  4:04 PM Pawlus, Brayton Layman A wrote: Reason for CRM: Pt doesn't have insurance and wanted to speak with Clifton James for financial advice. >> Nov 29, 2020 10:17 AM Blase Mess A wrote: Pt is calling to speak to [place] again. Set up pt NPA >> Nov 18, 2020  2:15 PM Celene Kras wrote: Pt calling again to speak with Clifton James. Please advise.

## 2021-08-22 ENCOUNTER — Other Ambulatory Visit: Payer: Self-pay | Admitting: Internal Medicine

## 2021-08-22 ENCOUNTER — Other Ambulatory Visit: Payer: Self-pay

## 2021-08-22 DIAGNOSIS — E119 Type 2 diabetes mellitus without complications: Secondary | ICD-10-CM

## 2021-08-22 MED ORDER — INSULIN GLARGINE 100 UNIT/ML ~~LOC~~ SOLN
15.0000 [IU] | Freq: Every day | SUBCUTANEOUS | 0 refills | Status: DC
  Filled 2021-08-22: qty 10, 28d supply, fill #0

## 2021-08-22 NOTE — Telephone Encounter (Signed)
Requested Prescriptions  Pending Prescriptions Disp Refills   insulin glargine (LANTUS) 100 UNIT/ML injection 10 mL 0    Sig: Inject 0.15 mLs (15 Units total) into the skin at bedtime.     Endocrinology:  Diabetes - Insulins Passed - 08/22/2021  9:45 AM      Passed - HBA1C is between 0 and 7.9 and within 180 days    Hgb A1c MFr Bld  Date Value Ref Range Status  03/18/2021 7.4 (H) 4.8 - 5.6 % Final    Comment:             Prediabetes: 5.7 - 6.4          Diabetes: >6.4          Glycemic control for adults with diabetes: <7.0          Passed - Valid encounter within last 6 months    Recent Outpatient Visits          1 month ago Leg cramps   [name], MD   5 months ago Diabetes mellitus type 2 in nonobese [facility])   [name], MD   4 years ago Diabetes mellitus without complication [facility])   [name], MD   4 years ago Diabetes mellitus without complication [facility])   [name], MD   5 years ago Diabetes mellitus without complication [facility])   [place] Boykin Nearing, MD      Future Appointments            In 1 week Ladell Pier, MD [place]

## 2021-09-02 ENCOUNTER — Other Ambulatory Visit: Payer: Self-pay

## 2021-09-02 ENCOUNTER — Encounter: Payer: Self-pay | Admitting: Internal Medicine

## 2021-09-02 ENCOUNTER — Ambulatory Visit: Payer: Self-pay | Attending: Internal Medicine | Admitting: Internal Medicine

## 2021-09-02 VITALS — BP 148/89 | HR 74 | Resp 16 | Wt 157.6 lb

## 2021-09-02 DIAGNOSIS — I10 Essential (primary) hypertension: Secondary | ICD-10-CM

## 2021-09-02 DIAGNOSIS — R5382 Chronic fatigue, unspecified: Secondary | ICD-10-CM

## 2021-09-02 DIAGNOSIS — R252 Cramp and spasm: Secondary | ICD-10-CM

## 2021-09-02 DIAGNOSIS — E1165 Type 2 diabetes mellitus with hyperglycemia: Secondary | ICD-10-CM

## 2021-09-02 DIAGNOSIS — E039 Hypothyroidism, unspecified: Secondary | ICD-10-CM

## 2021-09-02 DIAGNOSIS — L309 Dermatitis, unspecified: Secondary | ICD-10-CM

## 2021-09-02 DIAGNOSIS — Z794 Long term (current) use of insulin: Secondary | ICD-10-CM

## 2021-09-02 DIAGNOSIS — G894 Chronic pain syndrome: Secondary | ICD-10-CM

## 2021-09-02 DIAGNOSIS — K029 Dental caries, unspecified: Secondary | ICD-10-CM

## 2021-09-02 LAB — GLUCOSE, POCT (MANUAL RESULT ENTRY): POC Glucose: 160 mg/dl — AB (ref 70–99)

## 2021-09-02 LAB — POCT GLYCOSYLATED HEMOGLOBIN (HGB A1C): HbA1c, POC (controlled diabetic range): 7.1 % — AB (ref 0.0–7.0)

## 2021-09-02 MED ORDER — CLOTRIMAZOLE 1 % EX CREA
1.0000 "application " | TOPICAL_CREAM | Freq: Two times a day (BID) | CUTANEOUS | 0 refills | Status: AC
  Filled 2021-09-02: qty 30, 15d supply, fill #0

## 2021-09-02 MED ORDER — HYDROCHLOROTHIAZIDE 12.5 MG PO TABS
12.5000 mg | ORAL_TABLET | Freq: Every day | ORAL | 3 refills | Status: AC
  Filled 2021-09-02 – 2021-09-09 (×2): qty 30, 30d supply, fill #0
  Filled 2021-11-17: qty 30, 30d supply, fill #1
  Filled 2021-12-31: qty 30, 30d supply, fill #2

## 2021-09-02 MED ORDER — GABAPENTIN 100 MG PO CAPS
200.0000 mg | ORAL_CAPSULE | Freq: Every day | ORAL | 3 refills | Status: AC
  Filled 2021-09-02 – 2021-09-09 (×2): qty 60, 30d supply, fill #0

## 2021-09-02 NOTE — Progress Notes (Signed)
Patient ID: Linda George, female    DOB: [birth year]  MRN: [id number]  CC: Diabetes and Hypertension   Subjective: Linda George is a 61 y.o. female who presents for f/u leg cramps, fatigue, dermatitis on legs, thyroid Her concerns today include:  Patient with history of DM type II, HTN, HL, hypothyroidism, gad.    This was a follow-up from her telephone visit done on 07/02/2021.  On that visit patient had several concerns including leg cramps, chronic fatigue/depression, dermatitis on her legs.  I had ordered some lab studies including TSH BMP and CK level but patient did not come to the lab to have those done.  I also ordered sleep study but she has not been called as yet with an appointment.  Leg cramps: Patient had reported that these cramps occur mainly at nights and that her legs were sensitive to touch.  She also reported that the legs got tired easily.  She was prescribed Lipitor in August 2022 which she took for 1 month and did not have a refill.  I advised that she stay off of the Lipitor until she is seen today for in person evaluation.  She has done so.  He does not feel that the Lipitor was the issue as she had been off of it for almost 2 months at the time that she reported the cramps to me. -She reports that her leg cramps are much better and not occurring every day as they were before.  No leg cramps with walking.  Chronic fatigue: Last visit she reports sleeping for 10 to 12 hours at nights but still wakes up feeling unrefreshed and lacking motivation and energy.  Endorsed morning headaches, sleeping during the day and some snoring.  She felt the Cymbalta and hydroxyzine contributed to her fatigue.  Did better with bupropion. -I recommend sleep study for which she has not been called as yet.  Orange card will expire on 16 February. -I also recommended that she speak with her mental health provider about her concerns with Cymbalta and hydroxyzine. -Saw mental health  provider 07/30/2021.  Cymbalta discontinued.  Started on Wellbutrin XL 150 mg daily instead. -Still complains of tiredness which she describes as an overwhelming feeling of tiredness between 6:54 PM in the evenings and sometimes during the day. -We will get TSH checked at the lab without the blood test that I have ordered.  Thinks she may have fibromyalgia based on her symptoms of tiredness and soreness.  Reports soreness in her arms, legs, neck, back and back is very sore when she stands.  No swelling in the joints.  HTN: Reports compliance with Norvasc that she took already for the morning.  Does not check blood pressure.  Blood pressure elevated today.  DM: Results for orders placed or performed in visit on 09/02/21  POCT glucose (manual entry)  Result Value Ref Range   POC Glucose 160 (A) 70 - 99 mg/dl  POCT glycosylated hemoglobin (Hb A1C)  Result Value Ref Range   Hemoglobin A1C     HbA1c POC (<> result, manual entry)     HbA1c, POC (prediabetic range)     HbA1c, POC (controlled diabetic range) 7.1 (A) 0.0 - 7.0 %  Continues on current dose of Lantus insulin 40 units daily and glimepiride 4 mg twice a day.  Complains of rash on the upper inner thighs that come and go.  No initiating factors.  Can itch at times.  Requests referral to dentist as she  has several decayed teeth.   Patient Active Problem List   Diagnosis Date Noted   Generalized anxiety disorder 01/27/2021   Subjective memory complaints 04/26/2016   Benign paroxysmal positional vertigo 01/03/2016   PTSD (post-traumatic stress disorder) 04/10/2015   Chronic fatigue 04/10/2015   Vitamin D deficiency 03/27/2015   Nonscarring hair loss 03/27/2015   Lightheaded 01/03/2015   Hyperlipidemia associated with type 2 diabetes mellitus ([place]) 11/01/2014   Tension headache, chronic 10/26/2014   Allergic rhinitis 10/26/2014   Diabetes mellitus without complication ([place])    Hypertension    Depression    Hypothyroidism       Current Outpatient Medications on File Prior to Visit  Medication Sig Dispense Refill   amLODipine (NORVASC) 10 MG tablet Take 1 tablet (10 mg total) by mouth daily. 90 tablet 0   aspirin 81 MG tablet Take 1 tablet by mouth daily.     atorvastatin (LIPITOR) 10 MG tablet Take 1 tablet (10 mg total) by mouth daily. 30 tablet 1   Blood Glucose Monitoring Suppl (TRUERESULT BLOOD GLUCOSE) W/DEVICE KIT 1 each by Does not apply route 3 (three) times daily before meals. 1 each 0   buPROPion (WELLBUTRIN XL) 150 MG 24 hr tablet Take 1 tablet (150 mg total) by mouth every morning. 30 tablet 3   cetirizine (ZYRTEC) 10 MG tablet Take 1 tablet (10 mg total) by mouth daily. 90 tablet 0   fluticasone (FLONASE) 50 MCG/ACT nasal spray Place 2 sprays into both nostrils daily. 48 g 0   glimepiride (AMARYL) 4 MG tablet Take 1 tablet (4 mg total) by mouth 2 (two) times daily. 180 tablet 0   glucose blood (TRUETEST TEST) test strip 1 each by Other route 3 (three) times daily. Use as instructed 300 each 0   hydrOXYzine (ATARAX/VISTARIL) 10 MG tablet Take 1 tablet (10 mg total) by mouth 3 (three) times daily as needed. 90 tablet 3   insulin glargine (LANTUS) 100 UNIT/ML injection Inject 0.15 mLs (15 Units total) into the skin at bedtime. 10 mL 0   levothyroxine (SYNTHROID) 75 MCG tablet TAKE 1 TABLET BY MOUTH DAILY BEFORE BREAKFAST. 90 tablet 0   TRUEPLUS LANCETS 28G MISC 1 each by Does not apply route 3 (three) times daily. 100 each 12   No current facility-administered medications on file prior to visit.    Allergies  Allergen Reactions   Ace Inhibitors Swelling    Social History   Socioeconomic History   Marital status: Divorced    Spouse name: Not on file   Number of children: Not on file   Years of education: Not on file   Highest education level: Not on file  Occupational History   Not on file  Tobacco Use   Smoking status: Never   Smokeless tobacco: Never  Substance and Sexual Activity    Alcohol use: No   Drug use: No   Sexual activity: Never  Other Topics Concern   Not on file  Social History Narrative   Not on file   Social Determinants of Health   Financial Resource Strain: High Risk   Difficulty of Paying Living Expenses: Very hard  Food Insecurity: No Food Insecurity   Worried About Running Out of Food in the Last Year: Never true   Ran Out of Food in the Last Year: Never true  Transportation Needs: Unmet Transportation Needs   Lack of Transportation (Medical): No   Lack of Transportation (Non-Medical): Yes  Physical Activity: Inactive   Days  of Exercise per Week: 0 days   Minutes of Exercise per Session: 0 min  Stress: Stress Concern Present   Feeling of Stress : Very much  Social Connections: Socially Isolated   Frequency of Communication with Friends and Family: Once a week   Frequency of Social Gatherings with Friends and Family: Once a week   Attends Religious Services: More than 4 times per year   Active Member of [employer] or Organizations: No   Attends Archivist Meetings: Never   Marital Status: Divorced  Human resources officer Violence: Not At Risk   Fear of Current or Ex-Partner: No   Emotionally Abused: No   Physically Abused: No   Sexually Abused: No    Family History  Problem Relation Age of Onset   Diabetes Mother    Heart disease Mother    Hypertension Mother    Cancer Mother    Diabetes Father    Heart disease Father    Hypertension Father     Past Surgical History:  Procedure Laterality Date   CESAREAN SECTION     3 of them   COLONOSCOPY WITH PROPOFOL N/A 06/11/2015   Procedure: COLONOSCOPY WITH PROPOFOL;  Surgeon: Garlan Fair, MD;  Location: [facility];  Service: Endoscopy;  Laterality: N/A;    ROS: Review of Systems Negative except as stated above  PHYSICAL EXAM: BP (!) 148/89    Pulse 74    Resp 16    Wt 157 lb 9.6 oz (71.5 kg)    SpO2 99%    BMI 27.05 kg/m   Physical Exam Initial BP reading today was  164/94.  Repeat was 148/89  General appearance - alert, well appearing, middle-aged older appearing female and in no distress.  She has a cane with her but gets on the exam table without help or assistive device Mental status -patient with quiet demeanor and slow to answer questions. Mouth -oral mucosa moist.  She is noted to have several cavities Neck - supple, no significant adenopathy Chest - clear to auscultation, no wheezes, rales or rhonchi, symmetric air entry Heart - normal rate, regular rhythm, normal S1, S2, no murmurs, rubs, clicks or gallops Musculoskeletal -no soft tissue tenderness on of the neck or around the shoulder girdle.  Mild soft tissue tenderness on palpation of the lumbar muscles in the lower back.  No soft tissue tenderness on palpation of the anterior chest.  There is good range of motion of the larger joints including the shoulders, elbows, wrists, knees and ankles. Extremities -no lower extremity edema.  Dorsalis pedis, posterior tibialis and popliteal pulses are 3+ bilaterally.  Legs and feet are warm.  No cyanosis noted in the feet. Skin -patient has about a 3 cm flat circular rash on the right upper inner thigh and 1 noted on the left anterior thigh.  These have defined borders with central clearing.  She also has hyperpigmented dermatitis changes on the lower legs from the mid shin down.  Patient states this has been there a while.  Depression screen [facility] 2/9 09/02/2021 07/30/2021 04/28/2021  Decreased Interest _0 Down, Depressed, Hopeless _1 PHQ - 2 Score _2 Altered sleeping _3 Tired, decreased energy _4 Change in appetite _5 Feeling bad or failure about yourself  _6 Trouble concentrating _7 Moving slowly or fidgety/restless _8 Suicidal thoughts 1 0 0  PHQ-9  Score _0 Difficult doing work/chores - Very difficult Very difficult  Some recent data might be hidden   GAD 7 : Generalized Anxiety Score 09/02/2021 07/30/2021  04/28/2021 01/27/2021  Nervous, Anxious, on Edge _1 Control/stop worrying _2 Worry too much - different things _3 Trouble relaxing _4 0  Restless 0 0 1 0  Easily annoyed or irritable 0 0 0 1  Afraid - awful might happen _5 Total GAD 7 Score _6 Anxiety Difficulty - Somewhat difficult Somewhat difficult Somewhat difficult     CMP Latest Ref Rng & Units 03/18/2021 07/30/2016 10/26/2014  Glucose 65 - 99 mg/dL 127(H) 113(H) 88  BUN 8 - 27 mg/dL _7 Creatinine 0.57 - 1.00 mg/dL 1.21(H) 1.03 1.10  Sodium 134 - 144 mmol/L 139 137 137  Potassium 3.5 - 5.2 mmol/L 4.4 4.6 4.6  Chloride 96 - 106 mmol/L 98 102 102  CO2 20 - 29 mmol/L _8 Calcium 8.7 - 10.3 mg/dL 9.6 9.7 9.4  Total Protein 6.0 - 8.5 g/dL 8.5 7.6 7.4  Total Bilirubin 0.0 - 1.2 mg/dL 0.4 0.6 0.5  Alkaline Phos 44 - 121 IU/L 131(H) 79 100  AST 0 - 40 IU/L 23 47(H) 39(H)  ALT 0 - 32 IU/L 15 59(H) 46(H)   Lipid Panel     Component Value Date/Time   CHOL 187 03/18/2021 1037   TRIG 103 03/18/2021 1037   HDL 52 03/18/2021 1037   CHOLHDL 3.6 03/18/2021 1037   CHOLHDL 4.7 10/26/2014 1240   VLDL 24 10/26/2014 1240   LDLCALC 116 (H) 03/18/2021 1037    CBC    Component Value Date/Time   WBC 6.5 03/18/2021 1037   WBC 6.3 10/26/2014 1240   RBC 5.17 03/18/2021 1037   RBC 4.28 10/26/2014 1240   HGB 14.4 03/18/2021 1037   HCT 43.1 03/18/2021 1037   PLT 346 03/18/2021 1037   MCV 83 03/18/2021 1037   MCH 27.9 03/18/2021 1037   MCH 28.7 10/26/2014 1240   MCHC 33.4 03/18/2021 1037   MCHC 33.5 10/26/2014 1240   RDW 14.0 03/18/2021 1037    ASSESSMENT AND PLAN: 1. Leg cramps -Patient reports this has improved with decreased frequency.  She has remained off Lipitor going on 3 months now.  I would still like to check chemistry and CK.  If these are okay, we may try low-dose statin again versus Zetia. - Basic metabolic panel; Future - CK; Future  2. Essential hypertension At goal.   Continue Norvasc 10 mg.  Add low-dose hydrochlorothiazide.  ACE or ARB not chosen because she has allergy listed for ACE-I on chart for edema - hydrochlorothiazide (HYDRODIURIL) 12.5 MG tablet; Take 1 tablet (12.5 mg total) by mouth daily.  Dispense: 30 tablet; Refill: 3  3. Type 2 diabetes mellitus with hyperglycemia, with long-term current use of insulin (HCC) A1c close to goal.  Continue glimepiride and current dose of Lantus insulin - POCT glucose (manual entry) - POCT glycosylated hemoglobin (Hb A1C)  4. Chronic fatigue Differential diagnosis include depression, uncontrolled hypothyroidism, fibromyalgia. -Her behavioral health provider has discontinued Cymbalta and she uses hydroxyzine only when needed.  Now on Wellbutrin instead which she wanted.  We will have my CMA check into her sleep study to see if this can be done before her orange card expires.  However I have told her  how to renew the orange card. - CBC; Future  5. Chronic pain disorder We discussed fibromyalgia and symptoms associated with that which includes chronic fatigue, chronic pain, sleep disturbance.  This all can be made worse with underlying depression.  She is agreeable to trying low-dose of gabapentin at bedtime.  Encouraged her to try move more if only for 10 minutes daily. - gabapentin (NEURONTIN) 100 MG capsule; Take 2 capsules (200 mg total) by mouth at bedtime.  Dispense: 60 capsule; Refill: 3 - Sedimentation Rate; Future - ANA w/Reflex; Future  6. Acquired hypothyroidism Continue levothyroxine. - TSH; Future  7. Decay, teeth - Ambulatory referral to Dentistry  8. Dermatitis This looks like ringworm.  We will try her with Lotrimin cream. - clotrimazole (LOTRIMIN AF) 1 % cream; Apply 1 application topically 2 (two) times daily.  Dispense: 30 g; Refill: 0     Patient was given the opportunity to ask questions.  Patient verbalized understanding of the plan and was able to repeat key elements of the plan.    Orders Placed This Encounter  Procedures   CBC   TSH   Basic metabolic panel   CK   Sedimentation Rate   ANA w/Reflex   Ambulatory referral to Dentistry   POCT glucose (manual entry)   POCT glycosylated hemoglobin (Hb A1C)     Requested Prescriptions   Signed Prescriptions Disp Refills   hydrochlorothiazide (HYDRODIURIL) 12.5 MG tablet 30 tablet 3    Sig: Take 1 tablet (12.5 mg total) by mouth daily.   gabapentin (NEURONTIN) 100 MG capsule 60 capsule 3    Sig: Take 2 capsules (200 mg total) by mouth at bedtime.   clotrimazole (LOTRIMIN AF) 1 % cream 30 g 0    Sig: Apply 1 application topically 2 (two) times daily.    Return in about 4 months (around 12/31/2021).  Karle Plumber, MD, FACP

## 2021-09-03 ENCOUNTER — Other Ambulatory Visit: Payer: Self-pay

## 2021-09-03 ENCOUNTER — Telehealth: Payer: Self-pay | Admitting: Internal Medicine

## 2021-09-03 ENCOUNTER — Encounter: Payer: Self-pay | Admitting: Internal Medicine

## 2021-09-03 DIAGNOSIS — G894 Chronic pain syndrome: Secondary | ICD-10-CM | POA: Insufficient documentation

## 2021-09-03 MED ORDER — INSULIN GLARGINE 100 UNIT/ML ~~LOC~~ SOLN
15.0000 [IU] | Freq: Every day | SUBCUTANEOUS | 0 refills | Status: DC
  Filled 2021-09-03: qty 10, 66d supply, fill #0

## 2021-09-03 NOTE — Telephone Encounter (Signed)
-----   Message from [place], [place] sent at 09/03/2021 11:49 AM EST ----- Regarding: RE: Sleep study Contacted the sleep center they tried contacting pt on dec 28th to get it scheduled but pt didn't answer and didn't have a vm. They will contact pt again  ----- Message ----- From: Ladell Pier, MD Sent: 09/03/2021   9:00 AM EST To: Jackelyn Knife, RMA, Ena Dawley Subject: Sleep study                                    This patient was referred for sleep study in November.  Her orange card will expire on the 16th of this month.  Can we try to get her in for sleep study before her card expires?

## 2021-09-04 ENCOUNTER — Other Ambulatory Visit: Payer: Self-pay

## 2021-09-05 ENCOUNTER — Ambulatory Visit (HOSPITAL_BASED_OUTPATIENT_CLINIC_OR_DEPARTMENT_OTHER): Payer: Self-pay | Attending: Internal Medicine | Admitting: Internal Medicine

## 2021-09-05 ENCOUNTER — Other Ambulatory Visit: Payer: Self-pay

## 2021-09-07 ENCOUNTER — Other Ambulatory Visit: Payer: Self-pay | Admitting: Internal Medicine

## 2021-09-07 DIAGNOSIS — E039 Hypothyroidism, unspecified: Secondary | ICD-10-CM

## 2021-09-07 DIAGNOSIS — E119 Type 2 diabetes mellitus without complications: Secondary | ICD-10-CM

## 2021-09-07 DIAGNOSIS — I10 Essential (primary) hypertension: Secondary | ICD-10-CM

## 2021-09-08 ENCOUNTER — Other Ambulatory Visit: Payer: Self-pay

## 2021-09-08 MED ORDER — AMLODIPINE BESYLATE 10 MG PO TABS
10.0000 mg | ORAL_TABLET | Freq: Every day | ORAL | 0 refills | Status: DC
  Filled 2021-09-08: qty 90, 90d supply, fill #0

## 2021-09-08 MED ORDER — LEVOTHYROXINE SODIUM 75 MCG PO TABS
ORAL_TABLET | ORAL | 0 refills | Status: DC
  Filled 2021-09-08: qty 90, 90d supply, fill #0

## 2021-09-08 MED ORDER — GLIMEPIRIDE 4 MG PO TABS
4.0000 mg | ORAL_TABLET | Freq: Two times a day (BID) | ORAL | 0 refills | Status: DC
  Filled 2021-09-08: qty 180, 90d supply, fill #0

## 2021-09-09 ENCOUNTER — Other Ambulatory Visit: Payer: Self-pay

## 2021-09-10 ENCOUNTER — Telehealth: Payer: Self-pay | Admitting: Internal Medicine

## 2021-09-10 ENCOUNTER — Ambulatory Visit: Payer: Self-pay | Attending: Internal Medicine

## 2021-09-10 DIAGNOSIS — R5382 Chronic fatigue, unspecified: Secondary | ICD-10-CM

## 2021-09-10 DIAGNOSIS — E039 Hypothyroidism, unspecified: Secondary | ICD-10-CM

## 2021-09-10 DIAGNOSIS — G894 Chronic pain syndrome: Secondary | ICD-10-CM

## 2021-09-10 DIAGNOSIS — R252 Cramp and spasm: Secondary | ICD-10-CM

## 2021-09-10 NOTE — Telephone Encounter (Signed)
-----   Message from Ena Dawley sent at 09/04/2021 12:41 PM EST ----- Regarding: RE: Sleep study Good Afternoon I called the sleep study  and they told me that she was schedule for  10/08/2021 I ask for a sooner appointment  and they said tomorrow Friday  2/3/at 8pm I was unable to talk to patient but I lvm with appointment details  .  Dr Wynetta Emery the [facility] don't cover  but she also has [place]  . [facility]  PH# [phone] [facility]  [place] 3RD FLOOR  APPT  09/05/2021 @ 8PM    ----- Message ----- From: Ladell Pier, MD Sent: 09/03/2021   9:00 AM EST To: Jackelyn Knife, RMA, Ena Dawley Subject: Sleep study                                    This patient was referred for sleep study in November.  Her orange card will expire on the 16th of this month.  Can we try to get her in for sleep study before her card expires?

## 2021-09-10 NOTE — Telephone Encounter (Signed)
Pt has already came in for lab work today 09/10/21

## 2021-09-10 NOTE — Telephone Encounter (Signed)
Patient called in to get ordder for blood work. Please call back.

## 2021-09-12 ENCOUNTER — Telehealth: Payer: Self-pay

## 2021-09-12 NOTE — Telephone Encounter (Signed)
Contacted pt to go over lab results pt didn't answer   Sent a CRM and forward labs to NT to give pt labs when they call back

## 2021-09-14 LAB — BASIC METABOLIC PANEL
BUN/Creatinine Ratio: 11 — ABNORMAL LOW (ref 12–28)
BUN: 14 mg/dL (ref 8–27)
CO2: 24 mmol/L (ref 20–29)
Calcium: 10.2 mg/dL (ref 8.7–10.3)
Chloride: 101 mmol/L (ref 96–106)
Creatinine, Ser: 1.28 mg/dL — ABNORMAL HIGH (ref 0.57–1.00)
Glucose: 91 mg/dL (ref 70–99)
Potassium: 4.4 mmol/L (ref 3.5–5.2)
Sodium: 141 mmol/L (ref 134–144)
eGFR: 47 mL/min/{1.73_m2} — ABNORMAL LOW (ref >59)

## 2021-09-14 LAB — TSH: TSH: 4.54 u[IU]/mL — ABNORMAL HIGH (ref 0.450–4.500)

## 2021-09-14 LAB — CBC
Hematocrit: 42 % (ref 34.0–46.6)
Hemoglobin: 14.3 g/dL (ref 11.1–15.9)
MCH: 28.5 pg (ref 26.6–33.0)
MCHC: 34 g/dL (ref 31.5–35.7)
MCV: 84 fL (ref 79–97)
Platelets: 443 10*3/uL (ref 150–450)
RBC: 5.01 x10E6/uL (ref 3.77–5.28)
RDW: 13.2 % (ref 11.7–15.4)
WBC: 6.4 10*3/uL (ref 3.4–10.8)

## 2021-09-14 LAB — CK: Total CK: 92 U/L (ref 32–182)

## 2021-09-14 LAB — SEDIMENTATION RATE: Sed Rate: 56 mm/hr — ABNORMAL HIGH (ref 0–40)

## 2021-09-14 LAB — ANA W/REFLEX: ANA Titer 1: NEGATIVE

## 2021-09-16 ENCOUNTER — Other Ambulatory Visit: Payer: Self-pay

## 2021-09-17 ENCOUNTER — Telehealth: Payer: Self-pay

## 2021-09-17 NOTE — Telephone Encounter (Signed)
Contacted pt to go over lab results pt didn't answer   Sent a CRM and forward labs to NT to give pt labs when they call back

## 2021-09-29 ENCOUNTER — Other Ambulatory Visit: Payer: Self-pay

## 2021-10-06 ENCOUNTER — Other Ambulatory Visit: Payer: Self-pay

## 2021-10-08 ENCOUNTER — Encounter (HOSPITAL_BASED_OUTPATIENT_CLINIC_OR_DEPARTMENT_OTHER): Payer: Self-pay | Admitting: Internal Medicine

## 2021-10-15 ENCOUNTER — Telehealth (HOSPITAL_COMMUNITY): Payer: Self-pay | Admitting: Psychiatry

## 2021-10-15 ENCOUNTER — Encounter (HOSPITAL_COMMUNITY): Payer: Self-pay

## 2021-10-30 ENCOUNTER — Other Ambulatory Visit: Payer: Self-pay

## 2021-10-30 ENCOUNTER — Ambulatory Visit: Payer: Self-pay | Attending: Physician Assistant | Admitting: Physician Assistant

## 2021-10-30 ENCOUNTER — Encounter: Payer: Self-pay | Admitting: Physician Assistant

## 2021-10-30 VITALS — BP 134/80 | HR 82 | Wt 156.8 lb

## 2021-10-30 DIAGNOSIS — Z794 Long term (current) use of insulin: Secondary | ICD-10-CM

## 2021-10-30 DIAGNOSIS — I10 Essential (primary) hypertension: Secondary | ICD-10-CM

## 2021-10-30 DIAGNOSIS — E559 Vitamin D deficiency, unspecified: Secondary | ICD-10-CM

## 2021-10-30 DIAGNOSIS — K029 Dental caries, unspecified: Secondary | ICD-10-CM

## 2021-10-30 DIAGNOSIS — R252 Cramp and spasm: Secondary | ICD-10-CM

## 2021-10-30 DIAGNOSIS — E039 Hypothyroidism, unspecified: Secondary | ICD-10-CM

## 2021-10-30 DIAGNOSIS — E1165 Type 2 diabetes mellitus with hyperglycemia: Secondary | ICD-10-CM

## 2021-10-30 DIAGNOSIS — E119 Type 2 diabetes mellitus without complications: Secondary | ICD-10-CM

## 2021-10-30 LAB — GLUCOSE, POCT (MANUAL RESULT ENTRY): POC Glucose: 120 mg/dl — AB (ref 70–99)

## 2021-10-30 MED ORDER — LEVOTHYROXINE SODIUM 75 MCG PO TABS
ORAL_TABLET | ORAL | 0 refills | Status: DC
  Filled 2021-10-30: qty 90, fill #0
  Filled 2021-12-31: qty 90, 90d supply, fill #0

## 2021-10-30 MED ORDER — IBUPROFEN 600 MG PO TABS
600.0000 mg | ORAL_TABLET | Freq: Three times a day (TID) | ORAL | 0 refills | Status: AC | PRN
  Filled 2021-10-30: qty 30, 10d supply, fill #0

## 2021-10-30 MED ORDER — METHOCARBAMOL 500 MG PO TABS
1000.0000 mg | ORAL_TABLET | Freq: Three times a day (TID) | ORAL | 0 refills | Status: AC | PRN
  Filled 2021-10-30: qty 90, 15d supply, fill #0

## 2021-10-30 MED ORDER — INSULIN GLARGINE 100 UNIT/ML ~~LOC~~ SOLN
20.0000 [IU] | Freq: Every day | SUBCUTANEOUS | 3 refills | Status: AC
  Filled 2021-10-30: qty 10, 50d supply, fill #0
  Filled 2022-03-23: qty 10, 28d supply, fill #1
  Filled 2022-03-23: qty 10, 50d supply, fill #1

## 2021-10-30 MED ORDER — GLIMEPIRIDE 4 MG PO TABS
4.0000 mg | ORAL_TABLET | Freq: Two times a day (BID) | ORAL | 0 refills | Status: DC
  Filled 2021-10-30 – 2021-12-31 (×2): qty 180, 90d supply, fill #0

## 2021-10-30 MED ORDER — AMLODIPINE BESYLATE 10 MG PO TABS
10.0000 mg | ORAL_TABLET | Freq: Every day | ORAL | 0 refills | Status: DC
  Filled 2021-10-30 – 2021-12-31 (×2): qty 90, 90d supply, fill #0

## 2021-10-30 NOTE — Progress Notes (Signed)
Patient ID: Linda George, female   DOB: 1961/02/18, 61 y.o.   MRN: [id number] ? ? ?Linda George, is a 61 y.o. female ? ?DPO:[id number] ? ?ERX:[id number] ? ?DOB - 02-11-1961 ? ?Chief Complaint  ?Patient presents with  ? Medication Refill  ?    ? ?Subjective:  ? ?Linda George is a 61 y.o. female here today for leg cramps and need for a dental referral.  She is going to apply for OC but she is also applying for medicaid.  She feels her leg cramps have worsened.  She has not taken any medication for this.   ? ?She has not been taking her lantus consistently.  She skips doses at times.  She is prescribed 15 units but sometimes doses herself up to 30 units.  Usu checks blood sugar at night and takes lantus at night.  Blood sugars range from 150 to over 300.   ? ? ?No problems updated. ? ?ALLERGIES: ?Allergies  ?Allergen Reactions  ? Ace Inhibitors Swelling  ? ? ?PAST MEDICAL HISTORY: ?Past Medical History:  ?Diagnosis Date  ? Allergy 20 years  ? seasonal  ? Depression 20 years  ? Diabetes mellitus without complication ([place]) 10 years  ? Hypertension 10 years  ? Hypothyroidism 7 years  ? hypo  ? ? ?MEDICATIONS AT HOME: ?Prior to Admission medications   ?Medication Sig Start Date End Date Taking? Authorizing Provider  ?aspirin 81 MG tablet Take 1 tablet by mouth daily. 01/01/11  Yes [provider]  ?Blood Glucose Monitoring Suppl (TRUERESULT BLOOD GLUCOSE) W/DEVICE KIT 1 each by Does not apply route 3 (three) times daily before meals. 10/26/14  Yes Funches, Adriana Mccallum, MD  ?buPROPion (WELLBUTRIN XL) 150 MG 24 hr tablet Take 1 tablet (150 mg total) by mouth every morning. 07/30/21  Yes Eulis Canner E, NP  ?cetirizine (ZYRTEC) 10 MG tablet Take 1 tablet (10 mg total) by mouth daily. 05/06/17  Yes Tresa Garter, MD  ?clotrimazole (LOTRIMIN AF) 1 % cream Apply 1 application topically 2 (two) times daily. 09/02/21  Yes Ladell Pier, MD  ?fluticasone (FLONASE) 50 MCG/ACT nasal spray Place 2 sprays into  both nostrils daily. 05/06/17  Yes Tresa Garter, MD  ?gabapentin (NEURONTIN) 100 MG capsule Take 2 capsules (200 mg total) by mouth at bedtime. 09/02/21  Yes Ladell Pier, MD  ?glucose blood (TRUETEST TEST) test strip 1 each by Other route 3 (three) times daily. Use as instructed 05/06/17  Yes Jegede, Olugbemiga E, MD  ?hydrochlorothiazide (HYDRODIURIL) 12.5 MG tablet Take 1 tablet (12.5 mg total) by mouth daily. 09/02/21  Yes Ladell Pier, MD  ?hydrOXYzine (ATARAX/VISTARIL) 10 MG tablet Take 1 tablet (10 mg total) by mouth 3 (three) times daily as needed. 04/28/21  Yes Eulis Canner E, NP  ?ibuprofen (ADVIL) 600 MG tablet Take 1 tablet (600 mg total) by mouth every 8 (eight) hours as needed. 10/30/21  Yes Argentina Donovan, PA-C  ?methocarbamol (ROBAXIN) 500 MG tablet Take 2 tablets (1,000 mg total) by mouth every 8 (eight) hours as needed for muscle spasms. 10/30/21  Yes Argentina Donovan, PA-C  ?TRUEPLUS LANCETS 28G MISC 1 each by Does not apply route 3 (three) times daily. 10/26/14  Yes Funches, Josalyn, MD  ?amLODipine (NORVASC) 10 MG tablet Take 1 tablet (10 mg total) by mouth daily. 10/30/21   Argentina Donovan, PA-C  ?glimepiride (AMARYL) 4 MG tablet Take 1 tablet (4 mg total) by mouth 2 (two) times daily. 10/30/21   Jazzlynn Rawe,  Dionne Bucy, PA-C  ?insulin glargine (LANTUS) 100 UNIT/ML injection Inject 0.2 mLs (20 Units total) into the skin at bedtime. 10/30/21   Argentina Donovan, PA-C  ?levothyroxine (SYNTHROID) 75 MCG tablet TAKE 1 TABLET BY MOUTH DAILY BEFORE BREAKFAST. 10/30/21   Argentina Donovan, PA-C  ? ? ?ROS: ?Neg HEENT ?Neg resp ?Neg cardiac ?Neg GI ?Neg GU ?Neg psych ?Neg neuro ? ?Objective:  ? ?Vitals:  ? 10/30/21 1430  ?BP: 134/80  ?Pulse: 82  ?SpO2: 98%  ?Weight: 156 lb 12.8 oz (71.1 kg)  ? ?Exam ?General appearance : Awake, alert, not in any distress. Speech Clear. Not toxic looking ?HEENT: Atraumatic and Normocephalic ?Neck: Supple, no JVD. No cervical lymphadenopathy.  ?Chest: Good  air entry bilaterally, CTAB.  No rales/rhonchi/wheezing ?CVS: S1 S2 regular, no murmurs.  ?Extremities: B/L Lower Ext shows no edema, both legs are warm to touch ?Neurology: Awake alert, and oriented X 3, CN II-XII intact, Non focal ?Skin: No Rash ? ?Data Review ?Lab Results  ?Component Value Date  ? HGBA1C 7.1 (A) 09/02/2021  ? HGBA1C 7.4 (H) 03/18/2021  ? HGBA1C 8.9 02/08/2017  ? ? ?Assessment & Plan  ? ?1. Type 2 diabetes mellitus with hyperglycemia, with long-term current use of insulin ([place]) ?Compliance and correct dosing imperative.  Check blood sugars fasting and bedtime. ?- Glucose (CBG) ?- insulin glargine (LANTUS) 100 UNIT/ML injection; Inject 0.2 mLs (20 Units total) into the skin at bedtime.  Dispense: 10 mL; Refill: 3 ? ?2. Leg cramps ?- Basic metabolic panel ?- ibuprofen (ADVIL) 600 MG tablet; Take 1 tablet (600 mg total) by mouth every 8 (eight) hours as needed.  Dispense: 30 tablet; Refill: 0 ?- methocarbamol (ROBAXIN) 500 MG tablet; Take 2 tablets (1,000 mg total) by mouth every 8 (eight) hours as needed for muscle spasms.  Dispense: 90 tablet; Refill: 0 ? ?3. Decay, teeth ?- Ambulatory referral to Dentistry ? ?4. Vitamin D deficiency ?- Vitamin D, 25-hydroxy ? ?5. Diabetes mellitus type 2 in nonobese (HCC) ?- glimepiride (AMARYL) 4 MG tablet; Take 1 tablet (4 mg total) by mouth 2 (two) times daily.  Dispense: 180 tablet; Refill: 0 ? ?6. Essential hypertension ?- Basic metabolic panel ?- amLODipine (NORVASC) 10 MG tablet; Take 1 tablet (10 mg total) by mouth daily.  Dispense: 90 tablet; Refill: 0 ? ?7. Acquired hypothyroidism ?- levothyroxine (SYNTHROID) 75 MCG tablet; TAKE 1 TABLET BY MOUTH DAILY BEFORE BREAKFAST.  Dispense: 90 tablet; Refill: 0 ? ?Patient have been counseled extensively about nutrition and exercise. Other issues discussed during this visit include: low cholesterol diet, weight control and daily exercise, foot care, annual eye examinations at Ophthalmology, importance of adherence  with medications and regular follow-up. We also discussed long term complications of uncontrolled diabetes and hypertension.  ? ?Return for keep June appt with Dr Wynetta Emery. ? ?The patient was given clear instructions to go to ER or return to medical center if symptoms don't improve, worsen or new problems develop. The patient verbalized understanding. The patient was told to call to get lab results if they haven't heard anything in the next week.  ? ? ? ? ?Freeman Caldron, PA-C ?[place] ?[place], [place] ?[phone]   ?10/30/2021, 2:53 PM  ?

## 2021-10-31 LAB — BASIC METABOLIC PANEL
BUN/Creatinine Ratio: 13 (ref 12–28)
BUN: 17 mg/dL (ref 8–27)
CO2: 25 mmol/L (ref 20–29)
Calcium: 10.3 mg/dL (ref 8.7–10.3)
Chloride: 99 mmol/L (ref 96–106)
Creatinine, Ser: 1.34 mg/dL — ABNORMAL HIGH (ref 0.57–1.00)
Glucose: 105 mg/dL — ABNORMAL HIGH (ref 70–99)
Potassium: 3.7 mmol/L (ref 3.5–5.2)
Sodium: 138 mmol/L (ref 134–144)
eGFR: 45 mL/min/{1.73_m2} — ABNORMAL LOW (ref >59)

## 2021-10-31 LAB — VITAMIN D 25 HYDROXY (VIT D DEFICIENCY, FRACTURES): Vit D, 25-Hydroxy: 41.2 ng/mL (ref 30.0–100.0)

## 2021-11-04 ENCOUNTER — Other Ambulatory Visit: Payer: Self-pay

## 2021-11-17 ENCOUNTER — Other Ambulatory Visit: Payer: Self-pay

## 2021-11-24 ENCOUNTER — Other Ambulatory Visit: Payer: Self-pay

## 2021-12-31 ENCOUNTER — Other Ambulatory Visit: Payer: Self-pay

## 2022-01-01 ENCOUNTER — Other Ambulatory Visit: Payer: Self-pay

## 2022-01-06 ENCOUNTER — Ambulatory Visit: Payer: Self-pay | Admitting: Internal Medicine

## 2022-01-19 ENCOUNTER — Other Ambulatory Visit (HOSPITAL_COMMUNITY): Payer: Self-pay | Admitting: Psychiatry

## 2022-01-19 ENCOUNTER — Telehealth (HOSPITAL_COMMUNITY): Payer: Self-pay | Admitting: *Deleted

## 2022-01-19 ENCOUNTER — Other Ambulatory Visit: Payer: Self-pay

## 2022-01-19 DIAGNOSIS — F332 Major depressive disorder, recurrent severe without psychotic features: Secondary | ICD-10-CM

## 2022-01-19 MED ORDER — BUPROPION HCL ER (XL) 150 MG PO TB24
150.0000 mg | ORAL_TABLET | ORAL | 0 refills | Status: DC
  Filled 2022-01-19: qty 30, 30d supply, fill #0
  Filled 2022-01-19: qty 90, 90d supply, fill #0
  Filled 2022-01-19: qty 30, 30d supply, fill #0

## 2022-01-19 NOTE — Telephone Encounter (Signed)
PATIENT LVM "REQUESTING 90 DAY SUPPLY OF buPROPion (WELLBUTRIN XL) 150 MG 24 hr tablet--- BE SENT TO : [place] [facility]

## 2022-01-19 NOTE — Telephone Encounter (Signed)
Medication refilled and sent to preferred pharmacy

## 2022-01-29 ENCOUNTER — Telehealth (INDEPENDENT_AMBULATORY_CARE_PROVIDER_SITE_OTHER): Payer: Medicare Other | Admitting: Psychiatry

## 2022-01-29 ENCOUNTER — Encounter (HOSPITAL_COMMUNITY): Payer: Self-pay | Admitting: Psychiatry

## 2022-01-29 ENCOUNTER — Other Ambulatory Visit: Payer: Self-pay

## 2022-01-29 DIAGNOSIS — F332 Major depressive disorder, recurrent severe without psychotic features: Secondary | ICD-10-CM

## 2022-01-29 MED ORDER — ARIPIPRAZOLE 5 MG PO TABS
5.0000 mg | ORAL_TABLET | Freq: Every day | ORAL | 3 refills | Status: DC
  Filled 2022-01-29: qty 30, 30d supply, fill #0

## 2022-01-29 MED ORDER — BUPROPION HCL ER (XL) 300 MG PO TB24
300.0000 mg | ORAL_TABLET | ORAL | 3 refills | Status: DC
  Filled 2022-01-29: qty 30, 30d supply, fill #0
  Filled 2022-03-09 – 2022-03-23 (×2): qty 30, 30d supply, fill #1
  Filled 2022-04-30: qty 30, 30d supply, fill #2

## 2022-01-29 NOTE — Progress Notes (Signed)
[place] MD/PA/NP OP Progress Note   Virtual Visit via Telephone Note  I connected with Linda George on 01/29/22 at  1:30 PM EDT by telephone and verified that I am speaking with the correct person using two identifiers.  Location: Patient: home Provider: Clinic   I discussed the limitations, risks, security and privacy concerns of performing an evaluation and management service by telephone and the availability of in person appointments. I also discussed with the patient that there may be a patient responsible charge related to this service. The patient expressed understanding and agreed to proceed.   I provided 30 minutes of non-face-to-face time during this encounter.    01/29/2022 1:54 PM Lorita Forinash  MRN:  [id number]  Chief Complaint: "I have had lack of motivation and energy"  HPI: 61 year old female seen today for follow psychiatric evaluation.   She has a psychiatric history of anxiety, depression, and PTSD.  Currently she is Wellbutrin XL 150 mg and hydroxyzine 10 mg three times daily as needed. She notes that she stopped taken her hydroxyzine and notes that Wellbutrin is somewhat effective in managing her psychiatric conditions..   Today she is unable to logon virtually so assessment was done over the phone.  During exam she was pleasant, cooperative, and engaged in conversation.  Patient speech is slow and her voice tone is decreased.  She informed Probation officer that she continues to lack motivation and energy.  She reports that her depression is worsening and notes that she holds guilt about past mistakes that she inflicted on her children. Today provider conducted a GAD-7 and patient scored an 11.  Provider also conducted PHQ-9 and patient scored a 24.  She endorses hypersomnia noting that she sleeps 8 to 12 hours nightly.  Patient endorses passive SI however denies wanting to harm herself today.  Today she denies SI/HI/VAH, mania, paranoia.  Patient informed Probation officer that  her appetite is poor and reports that she is lost over 10 pounds in the last 3 months.  At this time she does not wish to restart hydroxyzine.  She was agreeable to increasing Wellbutrin XL 150 mg to Wellbutrin XL 300 mg.  She will start Abilify 5 mg to help manage mood. Potential side effects of medication and risks vs benefits of treatment vs non-treatment were explained and discussed. All questions were answered.  Other concerns none at this time.  Visit Diagnosis:    ICD-10-CM   1. Severe episode of recurrent major depressive disorder, without psychotic features ([place])  F33.2 buPROPion (WELLBUTRIN XL) 300 MG 24 hr tablet    ARIPiprazole (ABILIFY) 5 MG tablet      Past Psychiatric History: anxiety, depression, and PTSD  Past Medical History:  Past Medical History:  Diagnosis Date   Allergy 20 years   seasonal   Depression 20 years   Diabetes mellitus without complication ([place]) 10 years   Hypertension 10 years   Hypothyroidism 7 years   hypo    Past Surgical History:  Procedure Laterality Date   CESAREAN SECTION     3 of them   COLONOSCOPY WITH PROPOFOL N/A 06/11/2015   Procedure: COLONOSCOPY WITH PROPOFOL;  Surgeon: Garlan Fair, MD;  Location: [facility];  Service: Endoscopy;  Laterality: N/A;    Family Psychiatric History: Daughter ADHD and depression  Family History:  Family History  Problem Relation Age of Onset   Diabetes Mother    Heart disease Mother    Hypertension Mother    Cancer Mother  Diabetes Father    Heart disease Father    Hypertension Father     Social History:  Social History   Socioeconomic History   Marital status: Divorced    Spouse name: Not on file   Number of children: Not on file   Years of education: Not on file   Highest education level: Not on file  Occupational History   Not on file  Tobacco Use   Smoking status: Never   Smokeless tobacco: Never  Substance and Sexual Activity   Alcohol use: No   Drug use: No    Sexual activity: Never  Other Topics Concern   Not on file  Social History Narrative   Not on file   Social Determinants of Health   Financial Resource Strain: High Risk (12/31/2020)   Overall Financial Resource Strain (CARDIA)    Difficulty of Paying Living Expenses: Very hard  Food Insecurity: No Food Insecurity (12/31/2020)   Hunger Vital Sign    Worried About Running Out of Food in the Last Year: Never true    Ran Out of Food in the Last Year: Never true  Transportation Needs: Unmet Transportation Needs (12/31/2020)   PRAPARE - Transportation    Lack of Transportation (Medical): No    Lack of Transportation (Non-Medical): Yes  Physical Activity: Inactive (12/31/2020)   Exercise Vital Sign    Days of Exercise per Week: 0 days    Minutes of Exercise per Session: 0 min  Stress: Stress Concern Present (12/31/2020)   [place] of Stress : Very much  Social Connections: Socially Isolated (12/31/2020)   Social Connection and Isolation Panel [NHANES]    Frequency of Communication with Friends and Family: Once a week    Frequency of Social Gatherings with Friends and Family: Once a week    Attends Religious Services: More than 4 times per year    Active Member of [employer] or Organizations: No    Attends Archivist Meetings: Never    Marital Status: Divorced    Allergies:  Allergies  Allergen Reactions   Ace Inhibitors Swelling    Metabolic Disorder Labs: Lab Results  Component Value Date   HGBA1C 7.1 (A) 09/02/2021   No results found for: "PROLACTIN" Lab Results  Component Value Date   CHOL 187 03/18/2021   TRIG 103 03/18/2021   HDL 52 03/18/2021   CHOLHDL 3.6 03/18/2021   VLDL 24 10/26/2014   LDLCALC 116 (H) 03/18/2021   LDLCALC 108 (H) 02/08/2017   Lab Results  Component Value Date   TSH 4.540 (H) 09/10/2021   TSH 7.380 (H) 03/18/2021    Therapeutic Level Labs: No results found  for: "LITHIUM" No results found for: "VALPROATE" No results found for: "CBMZ"  Current Medications: Current Outpatient Medications  Medication Sig Dispense Refill   ARIPiprazole (ABILIFY) 5 MG tablet Take 1 tablet (5 mg total) by mouth daily. 30 tablet 3   amLODipine (NORVASC) 10 MG tablet Take 1 tablet (10 mg total) by mouth daily. 90 tablet 0   aspirin 81 MG tablet Take 1 tablet by mouth daily.     Blood Glucose Monitoring Suppl (TRUERESULT BLOOD GLUCOSE) W/DEVICE KIT 1 each by Does not apply route 3 (three) times daily before meals. 1 each 0   buPROPion (WELLBUTRIN XL) 300 MG 24 hr tablet Take 1 tablet (300 mg total) by mouth every morning. 30 tablet 3   cetirizine (ZYRTEC) 10 MG tablet  Take 1 tablet (10 mg total) by mouth daily. 90 tablet 0   clotrimazole (LOTRIMIN AF) 1 % cream Apply 1 application topically 2 (two) times daily. 30 g 0   fluticasone (FLONASE) 50 MCG/ACT nasal spray Place 2 sprays into both nostrils daily. 48 g 0   gabapentin (NEURONTIN) 100 MG capsule Take 2 capsules (200 mg total) by mouth at bedtime. 60 capsule 3   glimepiride (AMARYL) 4 MG tablet Take 1 tablet (4 mg total) by mouth 2 (two) times daily. 180 tablet 0   glucose blood (TRUETEST TEST) test strip 1 each by Other route 3 (three) times daily. Use as instructed 300 each 0   hydrochlorothiazide (HYDRODIURIL) 12.5 MG tablet Take 1 tablet (12.5 mg total) by mouth daily. 30 tablet 3   hydrOXYzine (ATARAX/VISTARIL) 10 MG tablet Take 1 tablet (10 mg total) by mouth 3 (three) times daily as needed. 90 tablet 3   ibuprofen (ADVIL) 600 MG tablet Take 1 tablet (600 mg total) by mouth every 8 (eight) hours as needed. 30 tablet 0   insulin glargine (LANTUS) 100 UNIT/ML injection Inject 0.2 mLs (20 Units total) into the skin at bedtime. 10 mL 3   levothyroxine (SYNTHROID) 75 MCG tablet TAKE 1 TABLET BY MOUTH DAILY BEFORE BREAKFAST. 90 tablet 0   methocarbamol (ROBAXIN) 500 MG tablet Take 2 tablets (1,000 mg total) by mouth  every 8 (eight) hours as needed for muscle spasms. 90 tablet 0   TRUEPLUS LANCETS 28G MISC 1 each by Does not apply route 3 (three) times daily. 100 each 12   No current facility-administered medications for this visit.     Musculoskeletal: Strength & Muscle Tone:  Unable to assess due to telephone visit Gait & Station:  Unable to assess due to telephone Patient leans: N/A  Psychiatric Specialty Exam: Review of Systems  There were no vitals taken for this visit.There is no height or weight on file to calculate BMI.  General Appearance:  Unable to assess due to telephone visit  Eye Contact:   Unable to assess due to telephone visit  Speech:  Clear and Coherent and Slow  Volume:  Decreased  Mood:  Anxious and Depressed  Affect:  Appropriate and Non-Congruent  Thought Process:  Coherent, Goal Directed, and Linear  Orientation:  Full (Time, Place, and Person)  Thought Content: WDL and Logical   Suicidal Thoughts:  Yes.  without intent/plan  Homicidal Thoughts:  No  Memory:  Immediate;   Good Recent;   Good Remote;   Good  Judgement:  Good  Insight:  Good  Psychomotor Activity:   Unable to assess due to telephone  Concentration:  Concentration: Good and Attention Span: Good  Recall:  Good  Fund of Knowledge: Good  Language: Good  Akathisia:   Unable to assess due to telephone visit  Handed:  Right  AIMS (if indicated): Unable to assess due to telephone visit  Assets:  Communication Skills Desire for Improvement Housing Leisure Time Physical Health Resilience  ADL's:  Intact  Cognition: WNL  Sleep:  Fair   Screenings: GAD-7    Flowsheet Row Video Visit from 01/29/2022 in [facility] Office Visit from 09/02/2021 in [place] Video Visit from 07/30/2021 in [facility] Video Visit from 04/28/2021 in [facility] Office Visit from 01/27/2021 in [facility]  Total GAD-7 Score 11 12 9 11 11       Mini-Mental  Early Office Visit from 04/21/2016 in [place]  Total Score (max 30 points ) 27      PHQ2-9    Flowsheet Row Video Visit from 01/29/2022 in [facility] Office Visit from 10/30/2021 in [place] Office Visit from 09/02/2021 in [place] Video Visit from 07/30/2021 in [facility] Video Visit from 04/28/2021 in [facility]  PHQ-2 Total Score 6 3 6 5 6   PHQ-9 Total Score 24 -- 25 21 20       Flowsheet Row Video Visit from 01/29/2022 in [facility] Office Visit from 01/27/2021 in [facility] Counselor from 12/31/2020 in [place] Error: Q7 should not be populated when Q6 is No Error: Q7 should not be populated when Q6 is No Low Risk        Assessment and Plan: Patient endorses anxiety, depression, and hypersomnia.  Reports that hydroxyzine over sedated her.  At this time she does not wish to restart hydroxyzine.  She was agreeable to increasing Wellbutrin XL 150 mg to Wellbutrin XL 300 mg.  She will start Abilify 5 mg to help manage mood.   1. Severe episode of recurrent major depressive disorder, without psychotic features ([place])  Increased- buPROPion (WELLBUTRIN XL) 300 MG 24 hr tablet; Take 1 tablet (300 mg total) by mouth every morning.  Dispense: 30 tablet; Refill: 3 Start- ARIPiprazole (ABILIFY) 5 MG tablet; Take 1 tablet (5 mg total) by mouth daily.  Dispense: 30 tablet; Refill: 3  Follow-up in 3 months Salley Slaughter, NP 01/29/2022, 1:54 PM

## 2022-01-30 ENCOUNTER — Other Ambulatory Visit: Payer: Self-pay

## 2022-03-09 ENCOUNTER — Other Ambulatory Visit: Payer: Self-pay | Admitting: Physician Assistant

## 2022-03-09 ENCOUNTER — Other Ambulatory Visit: Payer: Self-pay

## 2022-03-09 DIAGNOSIS — I10 Essential (primary) hypertension: Secondary | ICD-10-CM

## 2022-03-09 DIAGNOSIS — E039 Hypothyroidism, unspecified: Secondary | ICD-10-CM

## 2022-03-09 DIAGNOSIS — E119 Type 2 diabetes mellitus without complications: Secondary | ICD-10-CM

## 2022-03-10 ENCOUNTER — Other Ambulatory Visit: Payer: Self-pay

## 2022-03-10 MED ORDER — GLIMEPIRIDE 4 MG PO TABS
4.0000 mg | ORAL_TABLET | Freq: Two times a day (BID) | ORAL | 0 refills | Status: AC
  Filled 2022-03-10: qty 120, 60d supply, fill #0
  Filled 2022-03-23: qty 60, 30d supply, fill #0
  Filled 2022-04-30: qty 60, 30d supply, fill #1

## 2022-03-10 MED ORDER — AMLODIPINE BESYLATE 10 MG PO TABS
10.0000 mg | ORAL_TABLET | Freq: Every day | ORAL | 0 refills | Status: AC
  Filled 2022-03-10: qty 60, 60d supply, fill #0
  Filled 2022-03-23: qty 30, 30d supply, fill #0
  Filled 2022-04-30: qty 30, 30d supply, fill #1

## 2022-03-10 MED ORDER — LEVOTHYROXINE SODIUM 75 MCG PO TABS
ORAL_TABLET | ORAL | 0 refills | Status: AC
  Filled 2022-03-10: qty 60, 60d supply, fill #0
  Filled 2022-03-23: qty 30, 30d supply, fill #0
  Filled 2022-04-30: qty 30, 30d supply, fill #1

## 2022-03-10 NOTE — Telephone Encounter (Signed)
Requested Prescriptions  Pending Prescriptions Disp Refills  . glimepiride (AMARYL) 4 MG tablet 180 tablet 0    Sig: Take 1 tablet (4 mg total) by mouth 2 (two) times daily.     Endocrinology:  Diabetes - Sulfonylureas Failed - 03/09/2022 10:59 AM      Failed - HBA1C is between 0 and 7.9 and within 180 days    HbA1c, POC (controlled diabetic range)  Date Value Ref Range Status  09/02/2021 7.1 (A) 0.0 - 7.0 % Final         Failed - Cr in normal range and within 360 days    Creat  Date Value Ref Range Status  07/30/2016 1.03 0.50 - 1.05 mg/dL Final    Comment:      For patients > or = 63 years of age: The upper reference limit for Creatinine is approximately 13% higher for people identified as African-American.      Creatinine, Ser  Date Value Ref Range Status  10/30/2021 1.34 (H) 0.57 - 1.00 mg/dL Final   Creatinine, Urine  Date Value Ref Range Status  10/26/2014 95.0 mg/dL Final    Comment:    No reference range established.         Passed - Valid encounter within last 6 months    Recent Outpatient Visits          4 months ago Type 2 diabetes mellitus with hyperglycemia, with long-term current use of insulin [facility])   [place], [place], [place]   6 months ago Leg cramps   [place] Ladell Pier, MD   8 months ago Leg cramps   [place] Karle Plumber B, MD   12 months ago Diabetes mellitus type 2 in nonobese [facility])   [place] Karle Plumber B, MD   5 years ago Diabetes mellitus without complication [facility])   [place], [place], [place]             . amLODipine (NORVASC) 10 MG tablet 90 tablet 0    Sig: Take 1 tablet (10 mg total) by mouth daily.     Cardiovascular: Calcium Channel Blockers 2 Passed - 03/09/2022 10:59 AM      Passed - Last BP in normal range    BP Readings from Last 1  Encounters:  10/30/21 134/80         Passed - Last Heart Rate in normal range    Pulse Readings from Last 1 Encounters:  10/30/21 82         Passed - Valid encounter within last 6 months    Recent Outpatient Visits          4 months ago Type 2 diabetes mellitus with hyperglycemia, with long-term current use of insulin [facility])   [place], [place], [place]   6 months ago Leg cramps   [place] Ladell Pier, MD   8 months ago Leg cramps   [place] Ladell Pier, MD   12 months ago Diabetes mellitus type 2 in nonobese [facility])   [place] [facility] Ladell Pier, MD   5 years ago Diabetes mellitus without complication [facility])   [place] [facility] Boykin Nearing, MD             .  levothyroxine (SYNTHROID) 75 MCG tablet 90 tablet 0    Sig: TAKE 1 TABLET BY MOUTH DAILY BEFORE BREAKFAST.     Endocrinology:  Hypothyroid Agents Failed - 03/09/2022 10:59 AM      Failed - TSH in normal range and within 360 days    TSH  Date Value Ref Range Status  09/10/2021 4.540 (H) 0.450 - 4.500 uIU/mL Final         Passed - Valid encounter within last 12 months    Recent Outpatient Visits          4 months ago Type 2 diabetes mellitus with hyperglycemia, with long-term current use of insulin [facility])   [place], [place], [place]   6 months ago Leg cramps   [place] Ladell Pier, MD   8 months ago Leg cramps   [place] Ladell Pier, MD   12 months ago Diabetes mellitus type 2 in nonobese [facility])   [place] [facility] Ladell Pier, MD   5 years ago Diabetes mellitus without complication [facility])   [place] [facility] Boykin Nearing, MD

## 2022-03-12 ENCOUNTER — Other Ambulatory Visit: Payer: Self-pay

## 2022-03-16 ENCOUNTER — Other Ambulatory Visit: Payer: Self-pay

## 2022-03-19 ENCOUNTER — Other Ambulatory Visit: Payer: Self-pay

## 2022-03-23 ENCOUNTER — Other Ambulatory Visit: Payer: Self-pay

## 2022-03-23 ENCOUNTER — Other Ambulatory Visit (HOSPITAL_COMMUNITY): Payer: Self-pay

## 2022-03-24 ENCOUNTER — Other Ambulatory Visit (HOSPITAL_COMMUNITY): Payer: Self-pay

## 2022-03-24 ENCOUNTER — Other Ambulatory Visit: Payer: Self-pay

## 2022-04-21 ENCOUNTER — Other Ambulatory Visit: Payer: Self-pay

## 2022-04-22 ENCOUNTER — Other Ambulatory Visit: Payer: Self-pay

## 2022-04-30 ENCOUNTER — Other Ambulatory Visit: Payer: Self-pay

## 2022-05-05 ENCOUNTER — Encounter (HOSPITAL_COMMUNITY): Payer: Self-pay | Admitting: Psychiatry

## 2022-05-05 ENCOUNTER — Telehealth (INDEPENDENT_AMBULATORY_CARE_PROVIDER_SITE_OTHER): Payer: Medicare Other | Admitting: Psychiatry

## 2022-05-05 ENCOUNTER — Other Ambulatory Visit: Payer: Self-pay

## 2022-05-05 DIAGNOSIS — F332 Major depressive disorder, recurrent severe without psychotic features: Secondary | ICD-10-CM

## 2022-05-05 MED ORDER — BUPROPION HCL ER (XL) 300 MG PO TB24
300.0000 mg | ORAL_TABLET | ORAL | 3 refills | Status: AC
  Filled 2022-05-05: qty 30, 30d supply, fill #0

## 2022-05-05 MED ORDER — ARIPIPRAZOLE 5 MG PO TABS
5.0000 mg | ORAL_TABLET | Freq: Every day | ORAL | 3 refills | Status: AC
  Filled 2022-05-05: qty 30, 30d supply, fill #0

## 2022-05-05 NOTE — Progress Notes (Signed)
[facility] MD/PA/NP OP Progress Note   Virtual Visit via Telephone Note  I connected with Tanai Bouler on 05/05/22 at  2:30 PM EDT by telephone and verified that I am speaking with the correct person using two identifiers.  Location: Patient: home Provider: Clinic   I discussed the limitations, risks, security and privacy concerns of performing an evaluation and management service by telephone and the availability of in person appointments. I also discussed with the patient that there may be a patient responsible charge related to this service. The patient expressed understanding and agreed to proceed.   I provided 30 minutes of non-face-to-face time during this encounter.    05/05/2022 10:21 AM Neelam Tiggs  MRN:  [id number]  Chief Complaint: "I wasn't able to get the medication because I could not afford it"  HPI: 61 year old female seen today for follow psychiatric evaluation.   She has a psychiatric history of anxiety, depression, and PTSD.  Currently she is Wellbutrin XL 300 mg and Abilify 5 mg daily. She notes that she has not started Abilify and notes that Wellbutrin is somewhat effective in managing her psychiatric condition.     Today she is unable to logon virtually so assessment was done over the phone.  During exam she was pleasant, cooperative, and engaged in conversation.  Patient speech is slow and her voice tone is decreased.  She informed Probation officer that she was not able to obtain Abilify because she could not afford it.  She informed Probation officer that she continues to lack motivation and energy.  Patient reports that she can sleep all day.  She reports that she can sleep over 12 hours at night and naps during the day.  Today provider conducted a GAD-7 and patient scored a 9, at her last visit she scored an 11.  Provider also conducted PHQ-9 and patient scored a 21, at her last visit she scored a 24.  She endorsed poor appetite due to stomach pain.  Patient quantifies her  pain as a 9 out of 10.  She reports that she will follow-up with her primary care doctor in a few weeks to address this pain.  Today she denies SI/HI/VAH, mania, paranoia (reports feeling paranoid when thinking of past trauma).   Patient informed Probation officer that she has insurance she would would like to try Abilify.  Today Abilify 5 mg reordered to help manage mood.  She will continue Wellbutrin as prescribed.  No other concerns noted at this time.    Visit Diagnosis:    ICD-10-CM   1. Severe episode of recurrent major depressive disorder, without psychotic features ([place])  F33.2 ARIPiprazole (ABILIFY) 5 MG tablet    buPROPion (WELLBUTRIN XL) 300 MG 24 hr tablet      Past Psychiatric History: anxiety, depression, and PTSD  Past Medical History:  Past Medical History:  Diagnosis Date   Allergy 20 years   seasonal   Depression 20 years   Diabetes mellitus without complication ([place]) 10 years   Hypertension 10 years   Hypothyroidism 7 years   hypo    Past Surgical History:  Procedure Laterality Date   CESAREAN SECTION     3 of them   COLONOSCOPY WITH PROPOFOL N/A 06/11/2015   Procedure: COLONOSCOPY WITH PROPOFOL;  Surgeon: Garlan Fair, MD;  Location: [facility];  Service: Endoscopy;  Laterality: N/A;    Family Psychiatric History: Daughter ADHD and depression  Family History:  Family History  Problem Relation Age of Onset   Diabetes  Mother    Heart disease Mother    Hypertension Mother    Cancer Mother    Diabetes Father    Heart disease Father    Hypertension Father     Social History:  Social History   Socioeconomic History   Marital status: Divorced    Spouse name: Not on file   Number of children: Not on file   Years of education: Not on file   Highest education level: Not on file  Occupational History   Not on file  Tobacco Use   Smoking status: Never   Smokeless tobacco: Never  Substance and Sexual Activity   Alcohol use: No   Drug use: No   Sexual  activity: Never  Other Topics Concern   Not on file  Social History Narrative   Not on file   Social Determinants of Health   Financial Resource Strain: High Risk (12/31/2020)   Overall Financial Resource Strain (CARDIA)    Difficulty of Paying Living Expenses: Very hard  Food Insecurity: No Food Insecurity (12/31/2020)   Hunger Vital Sign    Worried About Running Out of Food in the Last Year: Never true    Ran Out of Food in the Last Year: Never true  Transportation Needs: Unmet Transportation Needs (12/31/2020)   PRAPARE - Transportation    Lack of Transportation (Medical): No    Lack of Transportation (Non-Medical): Yes  Physical Activity: Inactive (12/31/2020)   Exercise Vital Sign    Days of Exercise per Week: 0 days    Minutes of Exercise per Session: 0 min  Stress: Stress Concern Present (12/31/2020)   [place] of Stress : Very much  Social Connections: Socially Isolated (12/31/2020)   Social Connection and Isolation Panel [NHANES]    Frequency of Communication with Friends and Family: Once a week    Frequency of Social Gatherings with Friends and Family: Once a week    Attends Religious Services: More than 4 times per year    Active Member of [employer] or Organizations: No    Attends Archivist Meetings: Never    Marital Status: Divorced    Allergies:  Allergies  Allergen Reactions   Ace Inhibitors Swelling    Metabolic Disorder Labs: Lab Results  Component Value Date   HGBA1C 7.1 (A) 09/02/2021   No results found for: "PROLACTIN" Lab Results  Component Value Date   CHOL 187 03/18/2021   TRIG 103 03/18/2021   HDL 52 03/18/2021   CHOLHDL 3.6 03/18/2021   VLDL 24 10/26/2014   LDLCALC 116 (H) 03/18/2021   LDLCALC 108 (H) 02/08/2017   Lab Results  Component Value Date   TSH 4.540 (H) 09/10/2021   TSH 7.380 (H) 03/18/2021    Therapeutic Level Labs: No results found for:  "LITHIUM" No results found for: "VALPROATE" No results found for: "CBMZ"  Current Medications: Current Outpatient Medications  Medication Sig Dispense Refill   amLODipine (NORVASC) 10 MG tablet Take 1 tablet (10 mg total) by mouth daily. 90 tablet 0   ARIPiprazole (ABILIFY) 5 MG tablet Take 1 tablet (5 mg total) by mouth daily. 30 tablet 3   aspirin 81 MG tablet Take 1 tablet by mouth daily.     Blood Glucose Monitoring Suppl (TRUERESULT BLOOD GLUCOSE) W/DEVICE KIT 1 each by Does not apply route 3 (three) times daily before meals. 1 each 0   buPROPion (WELLBUTRIN XL) 300 MG 24 hr tablet  Take 1 tablet (300 mg total) by mouth every morning. 30 tablet 3   cetirizine (ZYRTEC) 10 MG tablet Take 1 tablet (10 mg total) by mouth daily. 90 tablet 0   clotrimazole (LOTRIMIN AF) 1 % cream Apply 1 application topically 2 (two) times daily. 30 g 0   fluticasone (FLONASE) 50 MCG/ACT nasal spray Place 2 sprays into both nostrils daily. 48 g 0   gabapentin (NEURONTIN) 100 MG capsule Take 2 capsules (200 mg total) by mouth at bedtime. 60 capsule 3   glimepiride (AMARYL) 4 MG tablet Take 1 tablet (4 mg total) by mouth 2 (two) times daily. 180 tablet 0   glucose blood (TRUETEST TEST) test strip 1 each by Other route 3 (three) times daily. Use as instructed 300 each 0   hydrochlorothiazide (HYDRODIURIL) 12.5 MG tablet Take 1 tablet (12.5 mg total) by mouth daily. 30 tablet 3   hydrOXYzine (ATARAX/VISTARIL) 10 MG tablet Take 1 tablet (10 mg total) by mouth 3 (three) times daily as needed. 90 tablet 3   ibuprofen (ADVIL) 600 MG tablet Take 1 tablet (600 mg total) by mouth every 8 (eight) hours as needed. 30 tablet 0   insulin glargine (LANTUS) 100 UNIT/ML injection Inject 0.2 mLs (20 Units total) into the skin at bedtime. 10 mL 3   levothyroxine (SYNTHROID) 75 MCG tablet TAKE 1 TABLET BY MOUTH DAILY BEFORE BREAKFAST. 90 tablet 0   methocarbamol (ROBAXIN) 500 MG tablet Take 2 tablets (1,000 mg total) by mouth every  8 (eight) hours as needed for muscle spasms. 90 tablet 0   TRUEPLUS LANCETS 28G MISC 1 each by Does not apply route 3 (three) times daily. 100 each 12   No current facility-administered medications for this visit.     Musculoskeletal: Strength & Muscle Tone:  Unable to assess due to telephone visit Gait & Station:  Unable to assess due to telephone Patient leans: N/A  Psychiatric Specialty Exam: Review of Systems  There were no vitals taken for this visit.There is no height or weight on file to calculate BMI.  General Appearance:  Unable to assess due to telephone visit  Eye Contact:   Unable to assess due to telephone visit  Speech:  Clear and Coherent and Slow  Volume:  Decreased  Mood:  Depressed  Affect:  Appropriate and Non-Congruent  Thought Process:  Coherent, Goal Directed, and Linear  Orientation:  Full (Time, Place, and Person)  Thought Content: WDL and Logical   Suicidal Thoughts:  No  Homicidal Thoughts:  No  Memory:  Immediate;   Good Recent;   Good Remote;   Good  Judgement:  Good  Insight:  Good  Psychomotor Activity:   Unable to assess due to telephone  Concentration:  Concentration: Good and Attention Span: Good  Recall:  Good  Fund of Knowledge: Good  Language: Good  Akathisia:   Unable to assess due to telephone visit  Handed:  Right  AIMS (if indicated): Unable to assess due to telephone visit  Assets:  Communication Skills Desire for Improvement Housing Leisure Time Physical Health Resilience  ADL's:  Intact  Cognition: WNL  Sleep:  Fair   Screenings: GAD-7    Flowsheet Row Video Visit from 05/05/2022 in [facility] Video Visit from 01/29/2022 in [facility] Office Visit from 09/02/2021 in [facility] Video Visit from 07/30/2021 in [facility] Video Visit from 04/28/2021 in [facility]  Total GAD-7 Score  9 11 12 9 11       Mini-Mental    Flowsheet Row Office Visit from 04/21/2016 in [facility]  Total Score (max 30 points ) 27      PHQ2-9    Flowsheet Row Video Visit from 05/05/2022 in [facility] Video Visit from 01/29/2022 in [facility] Office Visit from 10/30/2021 in [place] Office Visit from 09/02/2021 in [place] Video Visit from 07/30/2021 in [facility]  PHQ-2 Total Score 6 6 3 6 5   PHQ-9 Total Score 21 24 -- 25 21      Flowsheet Row Video Visit from 01/29/2022 in [facility] Office Visit from 01/27/2021 in [facility] Counselor from 12/31/2020 in [place] Error: Q7 should not be populated when Q6 is No Error: Q7 should not be populated when Q6 is No Low Risk        Assessment and Plan: Patient endorses anxiety and hypersomnia.  Today she will restart Abilify 5 mg to help manage mood.  She will continue her other medications as prescribed  1. Severe episode of recurrent major depressive disorder, without psychotic features ([place])  Increased- buPROPion (WELLBUTRIN XL) 300 MG 24 hr tablet; Take 1 tablet (300 mg total) by mouth every morning.  Dispense: 30 tablet; Refill: 3 Restart- ARIPiprazole (ABILIFY) 5 MG tablet; Take 1 tablet (5 mg total) by mouth daily.  Dispense: 30 tablet; Refill: 3  Follow-up in 3 months Salley Slaughter, NP 05/05/2022, 10:21 AM

## 2022-05-26 ENCOUNTER — Other Ambulatory Visit (HOSPITAL_COMMUNITY): Payer: Self-pay

## 2022-05-26 ENCOUNTER — Other Ambulatory Visit: Payer: Self-pay

## 2022-05-26 MED ORDER — DEXCOM G6 SENSOR MISC
1.0000 | 11 refills | Status: AC
  Filled 2022-05-26: qty 9, 90d supply, fill #0
  Filled 2023-02-04: qty 9, 90d supply, fill #1

## 2022-05-26 MED ORDER — VITAMIN D (ERGOCALCIFEROL) 1.25 MG (50000 UNIT) PO CAPS
50000.0000 [IU] | ORAL_CAPSULE | ORAL | 2 refills | Status: AC
  Filled 2022-05-26 – 2022-06-19 (×3): qty 12, 84d supply, fill #0

## 2022-05-26 MED ORDER — LEVOTHYROXINE SODIUM 75 MCG PO TABS
75.0000 ug | ORAL_TABLET | Freq: Every day | ORAL | 2 refills | Status: DC
  Filled 2022-05-26: qty 90, 90d supply, fill #0
  Filled 2022-09-01: qty 90, 90d supply, fill #1
  Filled 2022-12-03: qty 90, 90d supply, fill #2

## 2022-05-26 MED ORDER — DEXCOM G6 RECEIVER DEVI
0 refills | Status: AC
  Filled 2022-05-26: qty 1, 30d supply, fill #0

## 2022-05-26 MED ORDER — IBUPROFEN 600 MG PO TABS
600.0000 mg | ORAL_TABLET | Freq: Four times a day (QID) | ORAL | 2 refills | Status: AC | PRN
  Filled 2022-05-26: qty 90, 23d supply, fill #0

## 2022-05-26 MED ORDER — DEXCOM G6 TRANSMITTER MISC
3 refills | Status: AC
  Filled 2022-05-26: qty 1, 90d supply, fill #0

## 2022-05-26 MED ORDER — ARIPIPRAZOLE 5 MG PO TABS
5.0000 mg | ORAL_TABLET | Freq: Every day | ORAL | 2 refills | Status: DC
  Filled 2022-05-26: qty 90, 90d supply, fill #0

## 2022-05-26 MED ORDER — GLIMEPIRIDE 4 MG PO TABS
4.0000 mg | ORAL_TABLET | Freq: Every day | ORAL | 2 refills | Status: DC
  Filled 2022-05-26: qty 90, 90d supply, fill #0
  Filled 2022-09-01: qty 90, 90d supply, fill #1
  Filled 2022-12-03: qty 90, 90d supply, fill #2

## 2022-05-26 MED ORDER — AMLODIPINE BESYLATE 10 MG PO TABS
10.0000 mg | ORAL_TABLET | Freq: Every day | ORAL | 2 refills | Status: DC
  Filled 2022-05-26: qty 90, 90d supply, fill #0
  Filled 2022-09-01: qty 90, 90d supply, fill #1
  Filled 2022-12-03: qty 90, 90d supply, fill #2

## 2022-05-26 MED ORDER — INSULIN DETEMIR 100 UNIT/ML ~~LOC~~ SOLN
20.0000 [IU] | Freq: Every evening | SUBCUTANEOUS | 2 refills | Status: AC
  Filled 2022-05-26: qty 10, 50d supply, fill #0
  Filled 2022-07-29: qty 10, 50d supply, fill #1
  Filled 2022-10-23: qty 10, 50d supply, fill #2

## 2022-05-26 MED ORDER — BUPROPION HCL ER (XL) 300 MG PO TB24
300.0000 mg | ORAL_TABLET | Freq: Every morning | ORAL | 2 refills | Status: AC
  Filled 2022-05-26: qty 90, 90d supply, fill #0
  Filled 2022-09-08: qty 90, 90d supply, fill #1
  Filled 2022-12-27: qty 90, 90d supply, fill #2

## 2022-05-27 ENCOUNTER — Other Ambulatory Visit: Payer: Self-pay

## 2022-05-27 ENCOUNTER — Other Ambulatory Visit (HOSPITAL_COMMUNITY): Payer: Self-pay

## 2022-05-27 MED ORDER — FREESTYLE LIBRE 2 READER DEVI: 0 refills | Status: DC

## 2022-05-27 MED ORDER — FREESTYLE LIBRE 2 SENSOR MISC: 11 refills | Status: DC

## 2022-05-28 ENCOUNTER — Other Ambulatory Visit (HOSPITAL_COMMUNITY): Payer: Self-pay

## 2022-05-29 ENCOUNTER — Other Ambulatory Visit (HOSPITAL_COMMUNITY): Payer: Self-pay

## 2022-05-31 ENCOUNTER — Other Ambulatory Visit: Payer: Self-pay

## 2022-05-31 ENCOUNTER — Encounter (HOSPITAL_BASED_OUTPATIENT_CLINIC_OR_DEPARTMENT_OTHER): Payer: Self-pay | Admitting: Emergency Medicine

## 2022-05-31 ENCOUNTER — Emergency Department (HOSPITAL_BASED_OUTPATIENT_CLINIC_OR_DEPARTMENT_OTHER)
Admission: EM | Admit: 2022-05-31 | Discharge: 2022-05-31 | Disposition: A | Discharge status: Home / Self Care | Payer: Medicare Other | Attending: Emergency Medicine | Admitting: Emergency Medicine

## 2022-05-31 DIAGNOSIS — Z7984 Long term (current) use of oral hypoglycemic drugs: Secondary | ICD-10-CM | POA: Diagnosis not present

## 2022-05-31 DIAGNOSIS — E119 Type 2 diabetes mellitus without complications: Secondary | ICD-10-CM | POA: Insufficient documentation

## 2022-05-31 DIAGNOSIS — Z79899 Other long term (current) drug therapy: Secondary | ICD-10-CM | POA: Diagnosis not present

## 2022-05-31 DIAGNOSIS — Z794 Long term (current) use of insulin: Secondary | ICD-10-CM | POA: Diagnosis not present

## 2022-05-31 DIAGNOSIS — Z7982 Long term (current) use of aspirin: Secondary | ICD-10-CM | POA: Insufficient documentation

## 2022-05-31 DIAGNOSIS — T7840XA Allergy, unspecified, initial encounter: Secondary | ICD-10-CM | POA: Diagnosis present

## 2022-05-31 DIAGNOSIS — T782XXA Anaphylactic shock, unspecified, initial encounter: Secondary | ICD-10-CM | POA: Insufficient documentation

## 2022-05-31 DIAGNOSIS — I10 Essential (primary) hypertension: Secondary | ICD-10-CM | POA: Diagnosis not present

## 2022-05-31 MED ORDER — PREDNISONE 20 MG PO TABS: 40.0000 mg | ORAL_TABLET | Freq: Every day | ORAL | 0 refills | Status: AC

## 2022-05-31 MED ORDER — EPINEPHRINE 0.3 MG/0.3ML IJ SOAJ
0.3000 mg | Freq: Once | INTRAMUSCULAR | Status: AC
  Administered 2022-05-31: 0.3 mg via INTRAMUSCULAR
  Filled 2022-05-31: qty 0.3

## 2022-05-31 MED ORDER — DIPHENHYDRAMINE HCL 50 MG/ML IJ SOLN
25.0000 mg | Freq: Once | INTRAMUSCULAR | Status: AC
  Administered 2022-05-31: 25 mg via INTRAVENOUS
  Filled 2022-05-31: qty 1

## 2022-05-31 MED ORDER — METHYLPREDNISOLONE SODIUM SUCC 125 MG IJ SOLR
125.0000 mg | Freq: Once | INTRAMUSCULAR | Status: AC
  Administered 2022-05-31: 125 mg via INTRAVENOUS
  Filled 2022-05-31: qty 2

## 2022-05-31 MED ORDER — FAMOTIDINE IN NACL 20-0.9 MG/50ML-% IV SOLN
20.0000 mg | Freq: Once | INTRAVENOUS | Status: AC
  Administered 2022-05-31: 20 mg via INTRAVENOUS
  Filled 2022-05-31: qty 50

## 2022-05-31 MED ORDER — EPINEPHRINE 0.3 MG/0.3ML IJ SOAJ: 0.3000 mg | INTRAMUSCULAR | 0 refills | Status: AC | PRN

## 2022-05-31 NOTE — ED Provider Notes (Addendum)
[facility] EMERGENCY DEPARTMENT Provider Note   CSN: [id number] Arrival date & time: 05/31/22  2110     History  Chief Complaint  Patient presents with   Allergic Reaction    Linda George is a 61 y.o. female.  Patient here with allergic reaction.  History of allergy to shellfish.  Not sure if there was cross-contamination with her food today.  She started to notice some swelling around her eyes and lips and tongue prior to arrival here.  Her throat feels itchy.  She does not have an EpiPen at home.  She took some expired Benadryl.  She denies any nausea or vomiting.  No respiratory distress.  Feels itchy.  No hives.  No new medications.  History of diabetes, hypertension, depression.  The history is provided by the patient.       Home Medications Prior to Admission medications   Medication Sig Start Date End Date Taking? Authorizing Provider  EPINEPHrine 0.3 mg/0.3 mL IJ SOAJ injection Inject 0.3 mg into the muscle as needed for anaphylaxis. 05/31/22  Yes , , DO  predniSONE (DELTASONE) 20 MG tablet Take 2 tablets (40 mg total) by mouth daily for 3 days. 05/31/22 06/03/22 Yes , , DO  amLODipine (NORVASC) 10 MG tablet Take 1 tablet (10 mg total) by mouth daily. 03/10/22   Johnson, Deborah B, MD  amLODipine (NORVASC) 10 MG tablet Take 1 tablet (10 mg total) by mouth daily. 05/26/22     ARIPiprazole (ABILIFY) 5 MG tablet Take 1 tablet (5 mg total) by mouth daily. 05/05/22   Parsons, Brittney E, NP  ARIPiprazole (ABILIFY) 5 MG tablet Take 1 tablet (5 mg total) by mouth daily. 05/26/22     aspirin 81 MG tablet Take 1 tablet by mouth daily. 01/01/11   [provider]  Blood Glucose Monitoring Suppl (TRUERESULT BLOOD GLUCOSE) W/DEVICE KIT 1 each by Does not apply route 3 (three) times daily before meals. 10/26/14   Funches, Josalyn, MD  buPROPion (WELLBUTRIN XL) 300 MG 24 hr tablet Take 1 tablet (300 mg total) by mouth every morning.  05/05/22   Parsons, Brittney E, NP  buPROPion (WELLBUTRIN XL) 300 MG 24 hr tablet Take 1 tablet (300 mg total) by mouth every morning. 05/26/22     cetirizine (ZYRTEC) 10 MG tablet Take 1 tablet (10 mg total) by mouth daily. 05/06/17   Jegede, Olugbemiga E, MD  clotrimazole (LOTRIMIN AF) 1 % cream Apply 1 application topically 2 (two) times daily. 09/02/21   Johnson, Deborah B, MD  Continuous Blood Gluc Receiver (DEXCOM G6 RECEIVER) DEVI Use as directed for continuous glucose monitoring. 05/26/22     Continuous Blood Gluc Receiver (FREESTYLE LIBRE 2 READER) DEVI Scan as needed for continuous glucose monitoring. 05/27/22     Continuous Blood Gluc Sensor (DEXCOM G6 SENSOR) MISC Inject 1 sensor to the skin every 10 days for continuous glucose monitoring. 05/26/22     Continuous Blood Gluc Sensor (FREESTYLE LIBRE 2 SENSOR) MISC Inject 1 sensor to the skin every 14 days for continuous glucose monitoring. 05/27/22     Continuous Blood Gluc Transmit (DEXCOM G6 TRANSMITTER) MISC Use as directed for continuous glucose monitoring. Reuse transmitter for 90 days then discard and replace. 05/26/22     fluticasone (FLONASE) 50 MCG/ACT nasal spray Place 2 sprays into both nostrils daily. 05/06/17   Jegede, Olugbemiga E, MD  gabapentin (NEURONTIN) 100 MG capsule Take 2 capsules (200 mg total) by mouth at bedtime. 09/02/21   Johnson, Deborah B,   MD  glimepiride (AMARYL) 4 MG tablet Take 1 tablet (4 mg total) by mouth 2 (two) times daily. 03/10/22   Ladell Pier, MD  glimepiride (AMARYL) 4 MG tablet Take 1 tablet (4 mg total) by mouth daily before breakfast. 05/26/22     glucose blood (TRUETEST TEST) test strip 1 each by Other route 3 (three) times daily. Use as instructed 05/06/17   Tresa Garter, MD  hydrochlorothiazide (HYDRODIURIL) 12.5 MG tablet Take 1 tablet (12.5 mg total) by mouth daily. 09/02/21   Ladell Pier, MD  hydrOXYzine (ATARAX/VISTARIL) 10 MG tablet Take 1 tablet (10 mg total) by mouth 3  (three) times daily as needed. 04/28/21   Salley Slaughter, NP  ibuprofen (ADVIL) 600 MG tablet Take 1 tablet (600 mg total) by mouth every 8 (eight) hours as needed. 10/30/21   Argentina Donovan, PA-C  ibuprofen (ADVIL) 600 MG tablet Take 1 tablet (600 mg total) by mouth every 6 (six) hours as needed. 05/26/22     insulin detemir (LEVEMIR) 100 UNIT/ML injection Inject 0.2 mLs (20 Units total) into the skin nightly. 05/26/22     insulin glargine (LANTUS) 100 UNIT/ML injection Inject 0.2 mLs (20 Units total) into the skin at bedtime. 10/30/21   Argentina Donovan, PA-C  levothyroxine (SYNTHROID) 75 MCG tablet TAKE 1 TABLET BY MOUTH DAILY BEFORE BREAKFAST. 03/10/22   Ladell Pier, MD  levothyroxine (SYNTHROID) 75 MCG tablet Take 1 tablet (75 mcg total) by mouth daily at 6 (six) AM. 05/26/22     methocarbamol (ROBAXIN) 500 MG tablet Take 2 tablets (1,000 mg total) by mouth every 8 (eight) hours as needed for muscle spasms. 10/30/21   Argentina Donovan, PA-C  TRUEPLUS LANCETS 28G MISC 1 each by Does not apply route 3 (three) times daily. 10/26/14   Funches, Adriana Mccallum, MD  Vitamin D, Ergocalciferol, (DRISDOL) 1.25 MG (50000 UNIT) CAPS capsule Take 1 capsule (50,000 Units total) by mouth once a week. 05/26/22         Allergies    Ace inhibitors    Review of Systems   Review of Systems  Physical Exam Updated Vital Signs BP (!) 154/79   Pulse 73   Temp 98.3 F (36.8 C) (Oral)   Resp 17   Ht 5' 4" (1.626 m)   Wt 63.5 kg   SpO2 98%   BMI 24.03 kg/m  Physical Exam Vitals and nursing note reviewed.  Constitutional:      General: She is not in acute distress.    Appearance: She is well-developed. She is not ill-appearing.  HENT:     Head: Normocephalic and atraumatic.     Comments: There is trace swelling around her eyes, trace swelling to her lips, no obvious major swelling to the tongue, may be some mild swelling of the hard palate    Nose: Nose normal.     Mouth/Throat:     Mouth: Mucous  membranes are moist.  Eyes:     Extraocular Movements: Extraocular movements intact.     Conjunctiva/sclera: Conjunctivae normal.     Pupils: Pupils are equal, round, and reactive to light.  Cardiovascular:     Rate and Rhythm: Normal rate and regular rhythm.     Pulses: Normal pulses.     Heart sounds: Normal heart sounds. No murmur heard. Pulmonary:     Effort: Pulmonary effort is normal. No respiratory distress.     Breath sounds: Normal breath sounds.  Abdominal:     Palpations:  Abdomen is soft.     Tenderness: There is no abdominal tenderness.  Musculoskeletal:        General: No swelling.     Cervical back: Normal range of motion and neck supple.  Skin:    General: Skin is warm and dry.     Capillary Refill: Capillary refill takes less than 2 seconds.  Neurological:     General: No focal deficit present.     Mental Status: She is alert.  Psychiatric:        Mood and Affect: Mood normal.     ED Results / Procedures / Treatments   Labs (all labs ordered are listed, but only abnormal results are displayed) Labs Reviewed - No data to display  EKG None  Radiology No results found.  Procedures .Critical Care  Performed by: , , DO Authorized by: , , DO   Critical care provider statement:    Critical care time (minutes):  35   Critical care was necessary to treat or prevent imminent or life-threatening deterioration of the following conditions: anaphylaxis.   Critical care was time spent personally by me on the following activities:  Development of treatment plan with patient or surrogate, obtaining history from patient or surrogate, review of old charts and re-evaluation of patient's condition     Medications Ordered in ED Medications  diphenhydrAMINE (BENADRYL) injection 25 mg (25 mg Intravenous Given 05/31/22 2131)  methylPREDNISolone sodium succinate (SOLU-MEDROL) 125 mg/2 mL injection 125 mg (125 mg Intravenous Given 05/31/22 2135)   famotidine (PEPCID) IVPB 20 mg premix (20 mg Intravenous New Bag/Given 05/31/22 2135)  EPINEPHrine (EPI-PEN) injection 0.3 mg (0.3 mg Intramuscular Given 05/31/22 2140)    ED Course/ Medical Decision Making/ A&P                           Medical Decision Making Risk Prescription drug management.   Linda George is here with allergic reaction.  She has swelling around her eyes and lips and hard palate.  She feels like she is having a hard time breathing.  IM epinephrine given conservatively for concern for anaphylaxis but she has no wheezing, no hives, sensation of throat closing but no stridor.  We will also give IV Pepcid, IV Benadryl, IV Solu-Medrol.  Will reevaluate. This occurred after eating food.  She does have some shellfish allergy.  Does not think she had any cross-contamination.  Used to have EpiPen's but did not have any.  She took some expired Benadryl.  No new medications or other exposure otherwise.  Overall suspect food allergy.  Patient reevaluated with great improvement, still some swelling of conjuctiva bilaterrally but no swelling of tongue or lips and no stridor or wheezing.  Will prescribe EpiPen, prednisone.  Recommend continue use of Benadryl.  Recommend follow-up with primary care doctor.  Discharged in good condition.  This chart was dictated using voice recognition software.  Despite best efforts to proofread,  errors can occur which can change the documentation meaning.    Final Clinical Impression(s) / ED Diagnoses Final diagnoses:  Allergic reaction, initial encounter  Anaphylaxis, initial encounter    Rx / DC Orders ED Discharge Orders          Ordered    predniSONE (DELTASONE) 20 MG tablet  Daily        05/31/22 2204    EPINEPHrine 0.3 mg/0.3 mL IJ SOAJ injection  As needed        05/31/22   [place], [place], DO 05/31/22 [place], [place], DO 05/31/22 2251

## 2022-05-31 NOTE — Discharge Instructions (Signed)
Continue 25 mg of Benadryl every 4-6 hours as needed.  Take next dose of steroid tomorrow.  I have prescribed you an EpiPen to use if you have any severe allergic reaction that is making it difficult for you to breathe.  Follow-up with primary care doctor.

## 2022-05-31 NOTE — ED Triage Notes (Signed)
Pt with periorbital and oral swelling since around 2000; took 2 benadryl (they were expired by 3 yrs); started after eating; c/o throat itchy

## 2022-06-19 ENCOUNTER — Other Ambulatory Visit: Payer: Self-pay

## 2022-06-19 ENCOUNTER — Other Ambulatory Visit (HOSPITAL_COMMUNITY): Payer: Self-pay

## 2022-06-24 ENCOUNTER — Other Ambulatory Visit: Payer: Self-pay

## 2022-07-23 ENCOUNTER — Other Ambulatory Visit: Payer: Self-pay

## 2022-07-30 ENCOUNTER — Other Ambulatory Visit (HOSPITAL_COMMUNITY): Payer: Self-pay

## 2022-08-10 ENCOUNTER — Other Ambulatory Visit: Payer: Self-pay

## 2022-08-10 MED ORDER — HYDROCHLOROTHIAZIDE 12.5 MG PO CAPS
12.5000 mg | ORAL_CAPSULE | Freq: Every day | ORAL | 0 refills | Status: DC
  Filled 2022-08-10: qty 90, 90d supply, fill #0

## 2022-08-23 ENCOUNTER — Emergency Department (HOSPITAL_BASED_OUTPATIENT_CLINIC_OR_DEPARTMENT_OTHER)
Admission: EM | Admit: 2022-08-23 | Discharge: 2022-08-23 | Disposition: A | Discharge status: Home / Self Care | Payer: 59 | Attending: Emergency Medicine | Admitting: Emergency Medicine

## 2022-08-23 ENCOUNTER — Encounter (HOSPITAL_BASED_OUTPATIENT_CLINIC_OR_DEPARTMENT_OTHER): Payer: Self-pay | Admitting: Emergency Medicine

## 2022-08-23 ENCOUNTER — Other Ambulatory Visit: Payer: Self-pay

## 2022-08-23 DIAGNOSIS — E876 Hypokalemia: Secondary | ICD-10-CM | POA: Diagnosis not present

## 2022-08-23 DIAGNOSIS — I10 Essential (primary) hypertension: Secondary | ICD-10-CM | POA: Insufficient documentation

## 2022-08-23 DIAGNOSIS — R112 Nausea with vomiting, unspecified: Secondary | ICD-10-CM | POA: Insufficient documentation

## 2022-08-23 DIAGNOSIS — E119 Type 2 diabetes mellitus without complications: Secondary | ICD-10-CM | POA: Diagnosis not present

## 2022-08-23 DIAGNOSIS — R1013 Epigastric pain: Secondary | ICD-10-CM | POA: Insufficient documentation

## 2022-08-23 DIAGNOSIS — Z7982 Long term (current) use of aspirin: Secondary | ICD-10-CM | POA: Insufficient documentation

## 2022-08-23 DIAGNOSIS — Z794 Long term (current) use of insulin: Secondary | ICD-10-CM | POA: Insufficient documentation

## 2022-08-23 DIAGNOSIS — Z79899 Other long term (current) drug therapy: Secondary | ICD-10-CM | POA: Insufficient documentation

## 2022-08-23 LAB — CBC
HCT: 41.6 % (ref 36.0–46.0)
Hemoglobin: 14.4 g/dL (ref 12.0–15.0)
MCH: 29.2 pg (ref 26.0–34.0)
MCHC: 34.6 g/dL (ref 30.0–36.0)
MCV: 84.4 fL (ref 80.0–100.0)
Platelets: 380 10*3/uL (ref 150–400)
RBC: 4.93 MIL/uL (ref 3.87–5.11)
RDW: 12.3 % (ref 11.5–15.5)
WBC: 5.3 10*3/uL (ref 4.0–10.5)
nRBC: 0 % (ref 0.0–0.2)

## 2022-08-23 LAB — COMPREHENSIVE METABOLIC PANEL
ALT: 19 U/L (ref 0–44)
AST: 21 U/L (ref 15–41)
Albumin: 3.9 g/dL (ref 3.5–5.0)
Alkaline Phosphatase: 83 U/L (ref 38–126)
Anion gap: 10 (ref 5–15)
BUN: 19 mg/dL (ref 8–23)
CO2: 25 mmol/L (ref 22–32)
Calcium: 9.1 mg/dL (ref 8.9–10.3)
Chloride: 98 mmol/L (ref 98–111)
Creatinine, Ser: 1.24 mg/dL — ABNORMAL HIGH (ref 0.44–1.00)
GFR, Estimated: 49 mL/min — ABNORMAL LOW (ref >60)
Glucose, Bld: 153 mg/dL — ABNORMAL HIGH (ref 70–99)
Potassium: 3.2 mmol/L — ABNORMAL LOW (ref 3.5–5.1)
Sodium: 133 mmol/L — ABNORMAL LOW (ref 135–145)
Total Bilirubin: 0.5 mg/dL (ref 0.3–1.2)
Total Protein: 7.8 g/dL (ref 6.5–8.1)

## 2022-08-23 LAB — URINALYSIS, MICROSCOPIC (REFLEX): RBC / HPF: NONE SEEN RBC/hpf (ref 0–5)

## 2022-08-23 LAB — LIPASE, BLOOD: Lipase: 60 U/L — ABNORMAL HIGH (ref 11–51)

## 2022-08-23 LAB — URINALYSIS, ROUTINE W REFLEX MICROSCOPIC
Bilirubin Urine: NEGATIVE
Glucose, UA: NEGATIVE mg/dL
Hgb urine dipstick: NEGATIVE
Ketones, ur: NEGATIVE mg/dL
Nitrite: NEGATIVE
Protein, ur: NEGATIVE mg/dL
Specific Gravity, Urine: 1.01 (ref 1.005–1.030)
pH: 7 (ref 5.0–8.0)

## 2022-08-23 MED ORDER — ALUM & MAG HYDROXIDE-SIMETH 200-200-20 MG/5ML PO SUSP
30.0000 mL | Freq: Once | ORAL | Status: AC
  Administered 2022-08-23: 30 mL via ORAL
  Filled 2022-08-23: qty 30

## 2022-08-23 MED ORDER — LIDOCAINE VISCOUS HCL 2 % MT SOLN
15.0000 mL | Freq: Once | OROMUCOSAL | Status: AC
  Administered 2022-08-23: 15 mL via ORAL
  Filled 2022-08-23: qty 15

## 2022-08-23 MED ORDER — SUCRALFATE 1 G PO TABS: 1.0000 g | ORAL_TABLET | Freq: Three times a day (TID) | ORAL | 0 refills | Status: AC

## 2022-08-23 MED ORDER — ONDANSETRON HCL 4 MG/2ML IJ SOLN
4.0000 mg | Freq: Once | INTRAMUSCULAR | Status: AC
  Administered 2022-08-23: 4 mg via INTRAVENOUS
  Filled 2022-08-23: qty 2

## 2022-08-23 MED ORDER — PANTOPRAZOLE SODIUM 40 MG IV SOLR
40.0000 mg | Freq: Once | INTRAVENOUS | Status: AC
  Administered 2022-08-23: 40 mg via INTRAVENOUS
  Filled 2022-08-23: qty 10

## 2022-08-23 MED ORDER — OMEPRAZOLE 20 MG PO CPDR: 20.0000 mg | DELAYED_RELEASE_CAPSULE | Freq: Every day | ORAL | 0 refills | Status: AC

## 2022-08-23 NOTE — ED Notes (Signed)
D/c paperwork reviewed with pt, including prescriptions and follow up care.  All questions and/or concerns addressed at time of d/c.  No further needs expressed. . Pt verbalized understanding, Ambulatory with family to ED exit, NAD.   

## 2022-08-23 NOTE — ED Provider Notes (Signed)
[facility] [place] Provider Note   CSN: [id number] Arrival date & time: 08/23/22  1410     History  Chief Complaint  Patient presents with   Abdominal Pain    Epigastric     Linda George is a 62 y.o. female. With past medical history of diabetes, hypertension chronic fatigue, HLD, who presents to the emergency department with abdominal pain.  States she is having epigastric abdominal pain. Pain is sharp and non radiating. Pain is intermittent. Worsened about 1 hour after eating usually. She has associated nausea and vomiting which does not occur every time she eats. Pain has been ongoing for weeks to months but was worse today. She denies diarrhea. Denies previous abdominal surgeries, alcohol use, tylenol or NSAID use. She does take a daily baby aspirin but is unsure why. She has been doing this for years. Denies fevers, dysuria, vaginal discharge.    Abdominal Pain Associated symptoms: nausea and vomiting        Home Medications Prior to Admission medications   Medication Sig Start Date End Date Taking? Authorizing Provider  omeprazole (PRILOSEC) 20 MG capsule Take 1 capsule (20 mg total) by mouth daily. 08/23/22  Yes Mickie Hillier, PA-C  sucralfate (CARAFATE) 1 g tablet Take 1 tablet (1 g total) by mouth 4 (four) times daily -  with meals and at bedtime. 08/23/22 09/22/22 Yes Mickie Hillier, PA-C  amLODipine (NORVASC) 10 MG tablet Take 1 tablet (10 mg total) by mouth daily. 03/10/22   Ladell Pier, MD  amLODipine (NORVASC) 10 MG tablet Take 1 tablet (10 mg total) by mouth daily. 05/26/22     ARIPiprazole (ABILIFY) 5 MG tablet Take 1 tablet (5 mg total) by mouth daily. 05/05/22   Salley Slaughter, NP  aspirin 81 MG tablet Take 1 tablet by mouth daily. 01/01/11   [provider]  Blood Glucose Monitoring Suppl (TRUERESULT BLOOD GLUCOSE) W/DEVICE KIT 1 each by Does not apply route 3 (three) times daily before meals.  10/26/14   Funches, Adriana Mccallum, MD  buPROPion (WELLBUTRIN XL) 300 MG 24 hr tablet Take 1 tablet (300 mg total) by mouth every morning. 05/05/22   Salley Slaughter, NP  buPROPion (WELLBUTRIN XL) 300 MG 24 hr tablet Take 1 tablet (300 mg total) by mouth every morning. 05/26/22     cetirizine (ZYRTEC) 10 MG tablet Take 1 tablet (10 mg total) by mouth daily. 05/06/17   Tresa Garter, MD  clotrimazole (LOTRIMIN AF) 1 % cream Apply 1 application topically 2 (two) times daily. 09/02/21   Ladell Pier, MD  Continuous Blood Gluc Receiver (DEXCOM G6 RECEIVER) DEVI Use as directed for continuous glucose monitoring. 05/26/22     Continuous Blood Gluc Sensor (DEXCOM G6 SENSOR) MISC Inject 1 sensor to the skin every 10 days for continuous glucose monitoring. 05/26/22     Continuous Blood Gluc Transmit (DEXCOM G6 TRANSMITTER) MISC Use as directed for continuous glucose monitoring. Reuse transmitter for 90 days then discard and replace. 05/26/22     EPINEPHrine 0.3 mg/0.3 mL IJ SOAJ injection Inject 0.3 mg into the muscle as needed for anaphylaxis. 05/31/22   Curatolo, Adam, DO  fluticasone (FLONASE) 50 MCG/ACT nasal spray Place 2 sprays into both nostrils daily. 05/06/17   Tresa Garter, MD  gabapentin (NEURONTIN) 100 MG capsule Take 2 capsules (200 mg total) by mouth at bedtime. 09/02/21   Ladell Pier, MD  glimepiride (AMARYL) 4 MG tablet Take 1  tablet (4 mg total) by mouth 2 (two) times daily. 03/10/22   Ladell Pier, MD  glimepiride (AMARYL) 4 MG tablet Take 1 tablet (4 mg total) by mouth daily before breakfast. 05/26/22     glucose blood (TRUETEST TEST) test strip 1 each by Other route 3 (three) times daily. Use as instructed 05/06/17   Tresa Garter, MD  hydrochlorothiazide (HYDRODIURIL) 12.5 MG tablet Take 1 tablet (12.5 mg total) by mouth daily. 09/02/21   Ladell Pier, MD  hydrochlorothiazide (MICROZIDE) 12.5 MG capsule Take 1 capsule (12.5 mg total) by mouth daily. 08/10/22      hydrOXYzine (ATARAX/VISTARIL) 10 MG tablet Take 1 tablet (10 mg total) by mouth 3 (three) times daily as needed. 04/28/21   Salley Slaughter, NP  ibuprofen (ADVIL) 600 MG tablet Take 1 tablet (600 mg total) by mouth every 8 (eight) hours as needed. 10/30/21   Argentina Donovan, PA-C  ibuprofen (ADVIL) 600 MG tablet Take 1 tablet (600 mg total) by mouth every 6 (six) hours as needed. 05/26/22     insulin detemir (LEVEMIR) 100 UNIT/ML injection Inject 0.2 mLs (20 Units total) into the skin nightly. 05/26/22     insulin glargine (LANTUS) 100 UNIT/ML injection Inject 0.2 mLs (20 Units total) into the skin at bedtime. 10/30/21   Argentina Donovan, PA-C  levothyroxine (SYNTHROID) 75 MCG tablet TAKE 1 TABLET BY MOUTH DAILY BEFORE BREAKFAST. 03/10/22   Ladell Pier, MD  levothyroxine (SYNTHROID) 75 MCG tablet Take 1 tablet (75 mcg total) by mouth daily at 6 (six) AM. 05/26/22     methocarbamol (ROBAXIN) 500 MG tablet Take 2 tablets (1,000 mg total) by mouth every 8 (eight) hours as needed for muscle spasms. 10/30/21   Argentina Donovan, PA-C  TRUEPLUS LANCETS 28G MISC 1 each by Does not apply route 3 (three) times daily. 10/26/14   Funches, Adriana Mccallum, MD  Vitamin D, Ergocalciferol, (DRISDOL) 1.25 MG (50000 UNIT) CAPS capsule Take 1 capsule (50,000 Units total) by mouth once a week. 05/26/22         Allergies    Ace inhibitors    Review of Systems   Review of Systems  Gastrointestinal:  Positive for abdominal pain, nausea and vomiting.  All other systems reviewed and are negative.   Physical Exam Updated Vital Signs BP (!) 169/102   Pulse 76   Temp 98.4 F (36.9 C) (Oral)   Resp 18   Wt 65.8 kg   SpO2 98%   BMI 24.89 kg/m  Physical Exam Vitals and nursing note reviewed.  Constitutional:      General: She is not in acute distress.    Appearance: She is well-developed. She is not ill-appearing or toxic-appearing.  HENT:     Head: Normocephalic and atraumatic.  Eyes:     General: No  scleral icterus. Cardiovascular:     Heart sounds: Normal heart sounds. No murmur heard. Pulmonary:     Effort: Pulmonary effort is normal. No respiratory distress.     Breath sounds: Normal breath sounds.  Abdominal:     General: Abdomen is protuberant. Bowel sounds are normal. There is no distension.     Palpations: Abdomen is soft.     Tenderness: There is abdominal tenderness in the epigastric area. There is no right CVA tenderness, left CVA tenderness, guarding or rebound.  Skin:    General: Skin is warm and dry.     Capillary Refill: Capillary refill takes less than 2 seconds.  Findings: No rash.  Neurological:     General: No focal deficit present.     Mental Status: She is alert and oriented to person, place, and time.  Psychiatric:        Mood and Affect: Mood normal.        Behavior: Behavior normal.        Thought Content: Thought content normal.        Judgment: Judgment normal.     ED Results / Procedures / Treatments   Labs (all labs ordered are listed, but only abnormal results are displayed) Labs Reviewed  LIPASE, BLOOD - Abnormal; Notable for the following components:      Result Value   Lipase 60 (*)    All other components within normal limits  COMPREHENSIVE METABOLIC PANEL - Abnormal; Notable for the following components:   Sodium 133 (*)    Potassium 3.2 (*)    Glucose, Bld 153 (*)    Creatinine, Ser 1.24 (*)    GFR, Estimated 49 (*)    All other components within normal limits  CBC  URINALYSIS, ROUTINE W REFLEX MICROSCOPIC    EKG EKG Interpretation  Date/Time:  Sunday August 23 2022 14:28:45 EST Ventricular Rate:  88 PR Interval:  149 QRS Duration: 88 QT Interval:  406 QTC Calculation: 492 R Axis:   72 Text Interpretation: Sinus rhythm Borderline prolonged QT interval Confirmed by Campbell Stall ([id number]) on 12/06/3974 3:16:19 PM  Radiology No results found.  Procedures Procedures   Medications Ordered in ED Medications  pantoprazole  (PROTONIX) injection 40 mg (40 mg Intravenous Given 08/23/22 1632)  ondansetron (ZOFRAN) injection 4 mg (4 mg Intravenous Given 08/23/22 1631)  alum & mag hydroxide-simeth (MAALOX/MYLANTA) 200-200-20 MG/5ML suspension 30 mL (30 mLs Oral Given 08/23/22 1633)    And  lidocaine (XYLOCAINE) 2 % viscous mouth solution 15 mL (15 mLs Oral Given 08/23/22 1634)    ED Course/ Medical Decision Making/ A&P   {                            Medical Decision Making Amount and/or Complexity of Data Reviewed Labs: ordered.  Risk OTC drugs. Prescription drug management.  Initial Impression and Ddx 62 year old female who presents to the emergency department with epigastric abdominal pain.  She is overall well-appearing, nonseptic and nontoxic in appearance.  She is hemodynamically stable.  Afebrile.  On physical exam she has epigastric abdominal tenderness without peritonitic findings.  Findings concerning for peptic ulcer disease versus other.  Will also obtain EKG. Patient PMH that increases complexity of ED encounter: Diabetes, hypertension, hyperlipidemia Differential: Acute hepatobiliary disease, pancreatitis, appendicitis, PUD, gastritis, SBO, diverticulitis, colitis, viral gastroenteritis, Crohn's, UC, vascular catastrophe, UTI, pyelonephritis, renal stone, obstructed stone, infected stone, ovarian torsion, ectopic pregnancy, TOA, PID, STD, etc.   Interpretation of Diagnostics I independent reviewed and interpreted the labs as followed: Potassium mildly decreased at 3.2, creatinine is stable, no leukocytosis or anemia, lipase is 30  - I independently visualized the following imaging with scope of interpretation limited to determining acute life threatening conditions related to emergency care: Not indicated  Patient Reassessment and Ultimate Disposition/Management Labs are unremarkable. Physical exam with epigastric abdominal tenderness without peritoneal findings. Was given GI cocktail with  lidocaine, Protonix, Zofran with improvement in symptoms. EKG without ischemia or infarction, doubt ACS. Symptoms are inconsistent with acute hepatobiliary disease, lipase is negative doubt pancreatitis, symptoms inconsistent with an SBO, diverticulitis, colitis, vascular catastrophe.  No urinary symptoms and physical inconsistent with UTI, Pilo, stone, obstructed stone, infected stone, torsion, TOA, PID.  These are chronic symptoms concerning for peptic ulcer disease.  She was given medications as above with improvement in symptoms.  I will refer her to gastroenterologist for endoscopy as well as provide her with Prilosec and Carafate to help improve symptoms in the meantime.  She is agreeable to this plan.  The patient has been appropriately medically screened and/or stabilized in the ED. I have low suspicion for any other emergent medical condition which would require further screening, evaluation or treatment in the ED or require inpatient management. At time of discharge the patient is hemodynamically stable and in no acute distress. I have discussed work-up results and diagnosis with patient and answered all questions. Patient is agreeable with discharge plan. We discussed strict return precautions for returning to the emergency department and they verbalized understanding.     Patient management required discussion with the following services or consulting groups:  None  Complexity of Problems Addressed Chronic illness with exacerbation  Additional Data Reviewed and Analyzed Further history obtained from: Further history from spouse/family member, Past medical history and medications listed in the EMR, and Care Everywhere  Patient Encounter Risk Assessment Prescriptions and SDOH impact on management  Final Clinical Impression(s) / ED Diagnoses Final diagnoses:  Epigastric abdominal pain    Rx / DC Orders ED Discharge Orders          Ordered    omeprazole (PRILOSEC) 20 MG capsule   Daily        08/23/22 1741    sucralfate (CARAFATE) 1 g tablet  3 times daily with meals & bedtime        08/23/22 1741    Ambulatory referral to Gastroenterology        08/23/22 1741              Mickie Hillier, PA-C 08/23/22 1756    Cristie Hem, MD 08/23/22 1958

## 2022-08-23 NOTE — ED Triage Notes (Signed)
Epigastric pain , palpitation , NV . Pain started 6 months ago but today it is the worse . No further symptoms , seen by PCP for these issues .

## 2022-08-23 NOTE — Discharge Instructions (Signed)
You were seen in the emergency department today for abdominal pain. I have prescribed you two medications to help with your pain. The first is omeprazole which helps to reduce the amount of acid the stomach is producing. The second is sucralfate which helps to coat the stomach lining. I have referred you to a gastroenterologist for an endoscopy to evaluate you for ulcers. Please return to the ER for worsening symptoms.

## 2022-08-25 ENCOUNTER — Other Ambulatory Visit: Payer: Self-pay

## 2022-08-25 MED ORDER — POTASSIUM CHLORIDE ER 10 MEQ PO TBCR
10.0000 meq | EXTENDED_RELEASE_TABLET | Freq: Every day | ORAL | 0 refills | Status: DC
  Filled 2022-08-25: qty 14, 14d supply, fill #0

## 2022-09-02 ENCOUNTER — Other Ambulatory Visit (HOSPITAL_COMMUNITY): Payer: Self-pay

## 2022-09-09 ENCOUNTER — Other Ambulatory Visit (HOSPITAL_COMMUNITY): Payer: Self-pay

## 2022-09-23 ENCOUNTER — Other Ambulatory Visit: Payer: Self-pay

## 2022-09-23 MED ORDER — SUCRALFATE 1 G PO TABS
1.0000 g | ORAL_TABLET | Freq: Four times a day (QID) | ORAL | 1 refills | Status: AC
  Filled 2022-09-23: qty 120, 30d supply, fill #0

## 2022-09-23 MED ORDER — OMEPRAZOLE 20 MG PO CPDR
20.0000 mg | DELAYED_RELEASE_CAPSULE | Freq: Every day | ORAL | 1 refills | Status: AC
  Filled 2022-09-23: qty 90, 90d supply, fill #0

## 2022-09-30 ENCOUNTER — Other Ambulatory Visit: Payer: Self-pay

## 2022-09-30 MED ORDER — AMLODIPINE BESYLATE 10 MG PO TABS
10.0000 mg | ORAL_TABLET | Freq: Every day | ORAL | 2 refills | Status: AC
  Filled 2022-09-30 – 2023-09-29 (×2): qty 90, 90d supply, fill #0

## 2022-09-30 MED ORDER — DEXCOM G6 RECEIVER DEVI
1.0000 | Freq: Every day | 0 refills | Status: AC
  Filled 2022-09-30: qty 1, 30d supply, fill #0

## 2022-09-30 MED ORDER — OMEPRAZOLE 20 MG PO CPDR: 20.0000 mg | DELAYED_RELEASE_CAPSULE | Freq: Every day | ORAL | 1 refills | Status: AC

## 2022-09-30 MED ORDER — POTASSIUM CHLORIDE ER 10 MEQ PO TBCR
10.0000 meq | EXTENDED_RELEASE_TABLET | Freq: Every day | ORAL | 1 refills | Status: AC
  Filled 2022-09-30: qty 30, 30d supply, fill #0
  Filled 2022-11-09: qty 30, 30d supply, fill #1

## 2022-09-30 MED ORDER — SUCRALFATE 1 G PO TABS: 1.0000 g | ORAL_TABLET | Freq: Four times a day (QID) | ORAL | 1 refills | Status: AC

## 2022-09-30 MED ORDER — IBUPROFEN 600 MG PO TABS
600.0000 mg | ORAL_TABLET | Freq: Four times a day (QID) | ORAL | 2 refills | Status: AC | PRN
  Filled 2022-09-30: qty 90, 23d supply, fill #0

## 2022-09-30 MED ORDER — BUPROPION HCL ER (XL) 300 MG PO TB24: 300.0000 mg | ORAL_TABLET | ORAL | 2 refills | Status: AC

## 2022-09-30 MED ORDER — DEXCOM G6 SENSOR MISC
1.0000 | 11 refills | Status: AC
  Filled 2022-09-30: qty 3, 30d supply, fill #0

## 2022-09-30 MED ORDER — LEVOTHYROXINE SODIUM 75 MCG PO TABS: 75.0000 ug | ORAL_TABLET | Freq: Every day | ORAL | 2 refills | Status: AC

## 2022-09-30 MED ORDER — HYDROCHLOROTHIAZIDE 12.5 MG PO CAPS
12.5000 mg | ORAL_CAPSULE | Freq: Every day | ORAL | 0 refills | Status: DC
  Filled 2022-10-23 – 2022-11-09 (×2): qty 90, 90d supply, fill #0

## 2022-09-30 MED ORDER — GLIMEPIRIDE 4 MG PO TABS: 4.0000 mg | ORAL_TABLET | Freq: Every day | ORAL | 2 refills | Status: AC

## 2022-09-30 MED ORDER — INSULIN DETEMIR 100 UNIT/ML ~~LOC~~ SOLN: 20.0000 [IU] | Freq: Every evening | SUBCUTANEOUS | 2 refills | Status: AC

## 2022-09-30 MED ORDER — DEXCOM G6 TRANSMITTER MISC
1.0000 | Freq: Every day | 3 refills | Status: AC
  Filled 2022-09-30: qty 1, 90d supply, fill #0

## 2022-10-23 ENCOUNTER — Other Ambulatory Visit: Payer: Self-pay

## 2022-10-23 ENCOUNTER — Other Ambulatory Visit (HOSPITAL_COMMUNITY): Payer: Self-pay

## 2022-10-26 ENCOUNTER — Other Ambulatory Visit (HOSPITAL_COMMUNITY): Payer: Self-pay

## 2022-10-29 ENCOUNTER — Other Ambulatory Visit: Payer: Self-pay

## 2022-11-09 ENCOUNTER — Other Ambulatory Visit: Payer: Self-pay

## 2022-12-04 ENCOUNTER — Other Ambulatory Visit (HOSPITAL_COMMUNITY): Payer: Self-pay

## 2022-12-29 ENCOUNTER — Other Ambulatory Visit (HOSPITAL_COMMUNITY): Payer: Self-pay

## 2023-02-02 ENCOUNTER — Other Ambulatory Visit: Payer: Self-pay

## 2023-02-05 ENCOUNTER — Other Ambulatory Visit: Payer: Self-pay

## 2023-02-05 ENCOUNTER — Other Ambulatory Visit (HOSPITAL_COMMUNITY): Payer: Self-pay

## 2023-02-05 MED ORDER — HYDROCHLOROTHIAZIDE 12.5 MG PO CAPS
12.5000 mg | ORAL_CAPSULE | Freq: Every day | ORAL | 0 refills | Status: DC
  Filled 2023-02-05 (×2): qty 90, 90d supply, fill #0

## 2023-03-09 ENCOUNTER — Other Ambulatory Visit: Payer: Self-pay

## 2023-03-09 ENCOUNTER — Other Ambulatory Visit (HOSPITAL_COMMUNITY): Payer: Self-pay

## 2023-03-09 MED ORDER — PIOGLITAZONE HCL-GLIMEPIRIDE 30-4 MG PO TABS
1.0000 | ORAL_TABLET | Freq: Every day | ORAL | 0 refills | Status: AC
  Filled 2023-03-09: qty 90, 90d supply, fill #0

## 2023-03-09 MED ORDER — LEVOTHYROXINE SODIUM 50 MCG PO TABS
50.0000 ug | ORAL_TABLET | Freq: Every morning | ORAL | 0 refills | Status: AC
  Filled 2023-03-09: qty 90, 90d supply, fill #0

## 2023-03-11 ENCOUNTER — Other Ambulatory Visit (HOSPITAL_COMMUNITY): Payer: Self-pay

## 2023-03-11 MED ORDER — GLIMEPIRIDE 4 MG PO TABS
4.0000 mg | ORAL_TABLET | Freq: Every morning | ORAL | 2 refills | Status: AC
  Filled 2023-03-11: qty 90, 90d supply, fill #0

## 2023-03-11 MED ORDER — LEVOTHYROXINE SODIUM 75 MCG PO TABS
75.0000 ug | ORAL_TABLET | Freq: Every morning | ORAL | 2 refills | Status: AC
  Filled 2023-03-11: qty 90, 90d supply, fill #0

## 2023-03-11 MED ORDER — AMLODIPINE BESYLATE 10 MG PO TABS
10.0000 mg | ORAL_TABLET | Freq: Every day | ORAL | 2 refills | Status: AC
  Filled 2023-03-11: qty 90, 90d supply, fill #0
  Filled 2023-06-28: qty 90, 90d supply, fill #1
  Filled 2023-09-28: qty 90, 90d supply, fill #2

## 2023-03-12 ENCOUNTER — Other Ambulatory Visit: Payer: Self-pay

## 2023-03-12 ENCOUNTER — Other Ambulatory Visit (HOSPITAL_COMMUNITY): Payer: Self-pay

## 2023-03-12 MED ORDER — INSULIN DETEMIR 100 UNIT/ML ~~LOC~~ SOLN
20.0000 [IU] | Freq: Every evening | SUBCUTANEOUS | 1 refills | Status: AC
  Filled 2023-03-12 (×2): qty 10, 50d supply, fill #0

## 2023-04-05 ENCOUNTER — Other Ambulatory Visit (HOSPITAL_COMMUNITY): Payer: Self-pay

## 2023-04-06 ENCOUNTER — Other Ambulatory Visit: Payer: Self-pay

## 2023-04-06 MED ORDER — BUPROPION HCL ER (XL) 300 MG PO TB24
300.0000 mg | ORAL_TABLET | Freq: Every morning | ORAL | 2 refills | Status: AC
  Filled 2023-04-23: qty 90, 90d supply, fill #0
  Filled 2023-08-03: qty 90, 90d supply, fill #1
  Filled 2023-08-18: qty 90, 90d supply, fill #0
  Filled 2023-12-09: qty 90, 90d supply, fill #1

## 2023-04-06 MED ORDER — UREA 40 % EX CREA
TOPICAL_CREAM | Freq: Two times a day (BID) | CUTANEOUS | 5 refills | Status: AC
  Filled 2023-04-06: qty 255, 100d supply, fill #0

## 2023-04-23 ENCOUNTER — Other Ambulatory Visit (HOSPITAL_COMMUNITY): Payer: Self-pay

## 2023-04-23 ENCOUNTER — Other Ambulatory Visit: Payer: Self-pay

## 2023-04-26 ENCOUNTER — Other Ambulatory Visit: Payer: Self-pay

## 2023-04-26 ENCOUNTER — Other Ambulatory Visit (HOSPITAL_COMMUNITY): Payer: Self-pay

## 2023-04-26 MED ORDER — DEXCOM G6 SENSOR MISC: 1 refills | Status: DC

## 2023-04-26 MED ORDER — DEXCOM G6 TRANSMITTER MISC
3 refills | Status: DC
  Filled 2023-04-27: qty 1, 90d supply, fill #0

## 2023-04-26 MED ORDER — DEXCOM G6 RECEIVER DEVI: 0 refills | Status: DC

## 2023-04-27 ENCOUNTER — Other Ambulatory Visit: Payer: Self-pay

## 2023-04-27 ENCOUNTER — Other Ambulatory Visit (HOSPITAL_COMMUNITY): Payer: Self-pay

## 2023-05-23 ENCOUNTER — Other Ambulatory Visit (HOSPITAL_COMMUNITY): Payer: Self-pay

## 2023-05-24 ENCOUNTER — Other Ambulatory Visit (HOSPITAL_COMMUNITY): Payer: Self-pay

## 2023-05-24 MED ORDER — HYDROCHLOROTHIAZIDE 12.5 MG PO CAPS
12.5000 mg | ORAL_CAPSULE | Freq: Every day | ORAL | 0 refills | Status: DC
  Filled 2023-05-24: qty 90, 90d supply, fill #0

## 2023-05-25 ENCOUNTER — Other Ambulatory Visit (HOSPITAL_COMMUNITY): Payer: Self-pay

## 2023-06-08 ENCOUNTER — Other Ambulatory Visit: Payer: Self-pay

## 2023-06-08 ENCOUNTER — Other Ambulatory Visit (HOSPITAL_COMMUNITY): Payer: Self-pay

## 2023-06-08 MED ORDER — FREESTYLE LIBRE 14 DAY SENSOR MISC
2 refills | Status: AC
  Filled 2023-06-08: qty 2, 28d supply, fill #0

## 2023-06-22 ENCOUNTER — Other Ambulatory Visit (HOSPITAL_COMMUNITY): Payer: Self-pay

## 2023-06-28 ENCOUNTER — Other Ambulatory Visit (HOSPITAL_COMMUNITY): Payer: Self-pay

## 2023-06-29 ENCOUNTER — Other Ambulatory Visit: Payer: Self-pay

## 2023-06-30 ENCOUNTER — Other Ambulatory Visit: Payer: Self-pay

## 2023-06-30 ENCOUNTER — Other Ambulatory Visit (HOSPITAL_COMMUNITY): Payer: Self-pay

## 2023-06-30 MED ORDER — PIOGLITAZONE HCL-GLIMEPIRIDE 30-4 MG PO TABS
1.0000 | ORAL_TABLET | Freq: Every day | ORAL | 0 refills | Status: AC
  Filled 2023-06-30: qty 30, 30d supply, fill #0

## 2023-06-30 MED ORDER — LEVOTHYROXINE SODIUM 50 MCG PO TABS
50.0000 ug | ORAL_TABLET | Freq: Every morning | ORAL | 0 refills | Status: AC
  Filled 2023-06-30: qty 30, 30d supply, fill #0

## 2023-07-02 ENCOUNTER — Other Ambulatory Visit: Payer: Self-pay

## 2023-08-03 ENCOUNTER — Other Ambulatory Visit: Payer: Self-pay

## 2023-08-12 ENCOUNTER — Other Ambulatory Visit: Payer: Self-pay

## 2023-08-15 ENCOUNTER — Other Ambulatory Visit (HOSPITAL_COMMUNITY): Payer: Self-pay

## 2023-08-18 ENCOUNTER — Other Ambulatory Visit: Payer: Self-pay

## 2023-08-18 ENCOUNTER — Other Ambulatory Visit (HOSPITAL_COMMUNITY): Payer: Self-pay

## 2023-08-19 ENCOUNTER — Other Ambulatory Visit: Payer: Self-pay

## 2023-08-19 ENCOUNTER — Other Ambulatory Visit (HOSPITAL_COMMUNITY): Payer: Self-pay

## 2023-08-19 MED ORDER — EPINEPHRINE 0.3 MG/0.3ML IJ SOAJ
0.3000 mg | INTRAMUSCULAR | 2 refills | Status: AC | PRN
  Filled 2023-08-19 (×2): qty 4, 4d supply, fill #0

## 2023-08-19 MED ORDER — IBUPROFEN 600 MG PO TABS
600.0000 mg | ORAL_TABLET | Freq: Four times a day (QID) | ORAL | 2 refills | Status: AC | PRN
  Filled 2023-08-19 (×2): qty 90, 23d supply, fill #0

## 2023-08-19 MED ORDER — BUPROPION HCL ER (XL) 300 MG PO TB24
300.0000 mg | ORAL_TABLET | Freq: Every morning | ORAL | 2 refills | Status: AC
  Filled 2023-08-19 – 2024-03-17 (×3): qty 90, 90d supply, fill #0
  Filled 2024-07-10: qty 90, 90d supply, fill #1

## 2023-08-19 MED ORDER — INSULIN DETEMIR 100 UNIT/ML ~~LOC~~ SOLN
20.0000 [IU] | Freq: Every evening | SUBCUTANEOUS | 1 refills | Status: DC
  Filled 2023-08-19 (×2): qty 10, 50d supply, fill #0

## 2023-08-19 MED ORDER — LEVOTHYROXINE SODIUM 50 MCG PO TABS
50.0000 ug | ORAL_TABLET | Freq: Every morning | ORAL | 0 refills | Status: AC
  Filled 2023-08-19 (×2): qty 30, 30d supply, fill #0

## 2023-08-19 MED ORDER — GLIMEPIRIDE 4 MG PO TABS
4.0000 mg | ORAL_TABLET | Freq: Every day | ORAL | 1 refills | Status: AC
  Filled 2023-08-19 (×2): qty 90, 90d supply, fill #0

## 2023-08-19 MED ORDER — OMEPRAZOLE 20 MG PO CPDR
20.0000 mg | DELAYED_RELEASE_CAPSULE | Freq: Every day | ORAL | 1 refills | Status: AC
  Filled 2023-08-19: qty 90, 90d supply, fill #0

## 2023-08-19 MED ORDER — AMLODIPINE BESYLATE 10 MG PO TABS: 10.0000 mg | ORAL_TABLET | Freq: Every day | ORAL | 2 refills | Status: AC

## 2023-08-19 MED ORDER — FREESTYLE LIBRE 14 DAY SENSOR MISC
2 refills | Status: DC
  Filled 2023-08-19 (×2): qty 2, 28d supply, fill #0

## 2023-08-19 MED ORDER — HYDROCHLOROTHIAZIDE 12.5 MG PO CAPS
12.5000 mg | ORAL_CAPSULE | Freq: Every day | ORAL | 0 refills | Status: AC
  Filled 2023-08-19 (×2): qty 90, 90d supply, fill #0

## 2023-08-19 MED ORDER — SUCRALFATE 1 G PO TABS
1.0000 g | ORAL_TABLET | Freq: Four times a day (QID) | ORAL | 1 refills | Status: DC
  Filled 2023-08-19 (×2): qty 360, 90d supply, fill #0

## 2023-08-24 ENCOUNTER — Other Ambulatory Visit (HOSPITAL_COMMUNITY): Payer: Self-pay

## 2023-08-27 ENCOUNTER — Other Ambulatory Visit: Payer: Self-pay

## 2023-08-27 ENCOUNTER — Other Ambulatory Visit (HOSPITAL_COMMUNITY): Payer: Self-pay

## 2023-08-30 ENCOUNTER — Other Ambulatory Visit: Payer: Self-pay

## 2023-08-31 ENCOUNTER — Other Ambulatory Visit: Payer: Self-pay

## 2023-08-31 MED ORDER — LEVOTHYROXINE SODIUM 75 MCG PO TABS
75.0000 ug | ORAL_TABLET | Freq: Every morning | ORAL | 0 refills | Status: AC
  Filled 2023-09-20 (×2): qty 90, 90d supply, fill #0

## 2023-09-01 ENCOUNTER — Encounter (HOSPITAL_COMMUNITY): Payer: Self-pay

## 2023-09-01 ENCOUNTER — Other Ambulatory Visit (HOSPITAL_COMMUNITY): Payer: Self-pay

## 2023-09-02 ENCOUNTER — Other Ambulatory Visit (HOSPITAL_COMMUNITY): Payer: Self-pay

## 2023-09-06 ENCOUNTER — Other Ambulatory Visit: Payer: Self-pay

## 2023-09-06 ENCOUNTER — Other Ambulatory Visit (HOSPITAL_COMMUNITY): Payer: Self-pay

## 2023-09-06 MED ORDER — INSULIN GLARGINE 100 UNIT/ML ~~LOC~~ SOLN
20.0000 [IU] | Freq: Every evening | SUBCUTANEOUS | 2 refills | Status: AC
  Filled 2023-09-06: qty 6, 30d supply, fill #0
  Filled 2023-09-20: qty 10, 50d supply, fill #0
  Filled 2023-09-20: qty 10, 28d supply, fill #0

## 2023-09-20 ENCOUNTER — Other Ambulatory Visit: Payer: Self-pay

## 2023-09-20 ENCOUNTER — Other Ambulatory Visit (HOSPITAL_COMMUNITY): Payer: Self-pay

## 2023-09-22 ENCOUNTER — Other Ambulatory Visit: Payer: Self-pay

## 2023-09-22 ENCOUNTER — Other Ambulatory Visit (HOSPITAL_COMMUNITY): Payer: Self-pay

## 2023-09-22 MED ORDER — LEVOTHYROXINE SODIUM 75 MCG PO TABS: 75.0000 ug | ORAL_TABLET | ORAL | 0 refills | Status: DC

## 2023-09-25 ENCOUNTER — Other Ambulatory Visit (HOSPITAL_COMMUNITY): Payer: Self-pay

## 2023-09-29 ENCOUNTER — Other Ambulatory Visit (HOSPITAL_COMMUNITY): Payer: Self-pay

## 2023-09-29 ENCOUNTER — Other Ambulatory Visit: Payer: Self-pay

## 2023-10-04 ENCOUNTER — Other Ambulatory Visit (HOSPITAL_COMMUNITY): Payer: Self-pay

## 2023-12-07 ENCOUNTER — Other Ambulatory Visit (HOSPITAL_COMMUNITY): Payer: Self-pay

## 2023-12-07 ENCOUNTER — Other Ambulatory Visit: Payer: Self-pay

## 2023-12-07 MED ORDER — MOUNJARO 5 MG/0.5ML ~~LOC~~ SOAJ
5.0000 mg | SUBCUTANEOUS | 2 refills | Status: AC
Start: 2023-12-07 — End: ?
  Filled 2024-01-06 (×2): qty 2, 28d supply, fill #0
  Filled 2024-02-01: qty 2, 28d supply, fill #1
  Filled 2024-03-11: qty 2, 28d supply, fill #2

## 2023-12-07 MED ORDER — FREESTYLE LIBRE 3 PLUS SENSOR MISC
5 refills | Status: AC
  Filled 2023-12-07: qty 2, 30d supply, fill #0
  Filled 2024-01-21 (×2): qty 2, 30d supply, fill #1
  Filled 2024-02-25 – 2024-02-28 (×2): qty 2, 30d supply, fill #2
  Filled 2024-03-19: qty 2, 30d supply, fill #3

## 2023-12-07 MED ORDER — MOUNJARO 2.5 MG/0.5ML ~~LOC~~ SOAJ
2.5000 mg | SUBCUTANEOUS | 0 refills | Status: DC
  Filled 2023-12-07: qty 2, 28d supply, fill #0

## 2023-12-09 ENCOUNTER — Other Ambulatory Visit (HOSPITAL_COMMUNITY): Payer: Self-pay

## 2023-12-09 ENCOUNTER — Other Ambulatory Visit: Payer: Self-pay

## 2023-12-15 ENCOUNTER — Other Ambulatory Visit: Payer: Self-pay

## 2023-12-15 ENCOUNTER — Other Ambulatory Visit (HOSPITAL_COMMUNITY): Payer: Self-pay

## 2023-12-15 MED ORDER — LEVOTHYROXINE SODIUM 88 MCG PO TABS
88.0000 ug | ORAL_TABLET | ORAL | 0 refills | Status: DC
Start: 2023-12-14 — End: 2024-03-13
  Filled 2023-12-15: qty 30, 30d supply, fill #0
  Filled 2024-01-12: qty 30, 30d supply, fill #1
  Filled 2024-02-14: qty 30, 30d supply, fill #2

## 2023-12-30 ENCOUNTER — Other Ambulatory Visit: Payer: Self-pay

## 2023-12-30 ENCOUNTER — Other Ambulatory Visit (HOSPITAL_COMMUNITY): Payer: Self-pay

## 2023-12-30 MED ORDER — HYDROCHLOROTHIAZIDE 12.5 MG PO CAPS
12.5000 mg | ORAL_CAPSULE | Freq: Every day | ORAL | 0 refills | Status: DC
  Filled 2023-12-30: qty 90, 90d supply, fill #0

## 2023-12-30 MED ORDER — GLIMEPIRIDE 4 MG PO TABS: 4.0000 mg | ORAL_TABLET | Freq: Every day | ORAL | 1 refills | Status: AC

## 2023-12-30 MED ORDER — AMLODIPINE BESYLATE 10 MG PO TABS
10.0000 mg | ORAL_TABLET | Freq: Every day | ORAL | 1 refills | Status: AC
  Filled 2024-02-14: qty 90, 90d supply, fill #0
  Filled 2024-07-10: qty 90, 90d supply, fill #1

## 2023-12-30 MED ORDER — BUPROPION HCL ER (XL) 300 MG PO TB24
ORAL_TABLET | ORAL | 1 refills | Status: AC
  Filled 2024-07-10: qty 90, 90d supply, fill #0

## 2023-12-30 MED ORDER — OMEPRAZOLE 20 MG PO CPDR
20.0000 mg | DELAYED_RELEASE_CAPSULE | Freq: Every day | ORAL | 1 refills | Status: AC
  Filled 2023-12-30: qty 90, 90d supply, fill #0

## 2023-12-30 MED ORDER — LANTUS SOLOSTAR 100 UNIT/ML ~~LOC~~ SOPN
20.0000 [IU] | PEN_INJECTOR | Freq: Every evening | SUBCUTANEOUS | 1 refills | Status: DC
  Filled 2023-12-30: qty 15, 75d supply, fill #0

## 2024-01-06 ENCOUNTER — Other Ambulatory Visit: Payer: Self-pay

## 2024-01-06 ENCOUNTER — Other Ambulatory Visit (HOSPITAL_COMMUNITY): Payer: Self-pay

## 2024-01-12 ENCOUNTER — Other Ambulatory Visit (HOSPITAL_COMMUNITY): Payer: Self-pay

## 2024-01-13 ENCOUNTER — Other Ambulatory Visit: Payer: Self-pay

## 2024-01-14 ENCOUNTER — Other Ambulatory Visit: Payer: Self-pay

## 2024-01-14 ENCOUNTER — Other Ambulatory Visit (HOSPITAL_COMMUNITY): Payer: Self-pay

## 2024-01-14 MED ORDER — ROSUVASTATIN CALCIUM 5 MG PO TABS
5.0000 mg | ORAL_TABLET | Freq: Every day | ORAL | 1 refills | Status: AC
  Filled 2024-01-14: qty 90, 90d supply, fill #0
  Filled 2024-05-19: qty 90, 90d supply, fill #1

## 2024-01-21 ENCOUNTER — Other Ambulatory Visit (HOSPITAL_COMMUNITY): Payer: Self-pay

## 2024-01-21 ENCOUNTER — Other Ambulatory Visit: Payer: Self-pay

## 2024-02-01 ENCOUNTER — Other Ambulatory Visit: Payer: Self-pay

## 2024-02-01 ENCOUNTER — Other Ambulatory Visit (HOSPITAL_COMMUNITY): Payer: Self-pay

## 2024-02-06 ENCOUNTER — Other Ambulatory Visit (HOSPITAL_COMMUNITY): Payer: Self-pay

## 2024-02-14 ENCOUNTER — Other Ambulatory Visit (HOSPITAL_COMMUNITY): Payer: Self-pay

## 2024-02-14 ENCOUNTER — Other Ambulatory Visit: Payer: Self-pay

## 2024-02-18 ENCOUNTER — Other Ambulatory Visit (HOSPITAL_COMMUNITY): Payer: Self-pay

## 2024-02-25 ENCOUNTER — Other Ambulatory Visit (HOSPITAL_COMMUNITY): Payer: Self-pay

## 2024-02-28 ENCOUNTER — Other Ambulatory Visit (HOSPITAL_COMMUNITY): Payer: Self-pay

## 2024-02-29 ENCOUNTER — Other Ambulatory Visit (HOSPITAL_COMMUNITY): Payer: Self-pay

## 2024-03-11 ENCOUNTER — Other Ambulatory Visit (HOSPITAL_COMMUNITY): Payer: Self-pay

## 2024-03-13 ENCOUNTER — Other Ambulatory Visit (HOSPITAL_BASED_OUTPATIENT_CLINIC_OR_DEPARTMENT_OTHER): Payer: Self-pay

## 2024-03-13 ENCOUNTER — Other Ambulatory Visit: Payer: Self-pay

## 2024-03-13 ENCOUNTER — Other Ambulatory Visit (HOSPITAL_COMMUNITY): Payer: Self-pay

## 2024-03-13 MED ORDER — LEVOTHYROXINE SODIUM 88 MCG PO TABS
88.0000 ug | ORAL_TABLET | ORAL | 1 refills | Status: AC
  Filled 2024-03-13: qty 90, 90d supply, fill #0
  Filled 2024-07-10: qty 90, 90d supply, fill #1

## 2024-03-13 MED ORDER — HYDROCHLOROTHIAZIDE 12.5 MG PO CAPS
12.5000 mg | ORAL_CAPSULE | Freq: Every day | ORAL | 1 refills | Status: AC
  Filled 2024-03-13: qty 90, 90d supply, fill #0
  Filled 2024-07-10: qty 90, 90d supply, fill #1

## 2024-03-17 ENCOUNTER — Other Ambulatory Visit (HOSPITAL_COMMUNITY): Payer: Self-pay

## 2024-03-17 ENCOUNTER — Other Ambulatory Visit: Payer: Self-pay

## 2024-03-20 ENCOUNTER — Other Ambulatory Visit (HOSPITAL_COMMUNITY): Payer: Self-pay

## 2024-03-27 ENCOUNTER — Other Ambulatory Visit: Payer: Self-pay

## 2024-03-27 ENCOUNTER — Other Ambulatory Visit (HOSPITAL_COMMUNITY): Payer: Self-pay

## 2024-03-27 MED ORDER — FREESTYLE LIBRE 3 PLUS SENSOR MISC
5 refills | Status: AC
  Filled 2024-03-27: qty 2, 30d supply, fill #0
  Filled 2024-05-19: qty 2, 30d supply, fill #1
  Filled 2024-08-18: qty 2, 30d supply, fill #2

## 2024-05-19 ENCOUNTER — Other Ambulatory Visit (HOSPITAL_COMMUNITY): Payer: Self-pay

## 2024-05-19 MED ORDER — TIRZEPATIDE 5 MG/0.5ML ~~LOC~~ SOAJ
5.0000 mg | SUBCUTANEOUS | 2 refills | Status: AC
  Filled 2024-05-19: qty 2, 28d supply, fill #0
  Filled 2024-07-10: qty 2, 28d supply, fill #1
  Filled 2024-08-12: qty 2, 28d supply, fill #2

## 2024-06-28 ENCOUNTER — Other Ambulatory Visit: Payer: Self-pay

## 2024-06-28 ENCOUNTER — Other Ambulatory Visit (HOSPITAL_COMMUNITY): Payer: Self-pay

## 2024-06-28 MED ORDER — FREESTYLE LIBRE 3 PLUS SENSOR MISC
5 refills | Status: AC
  Filled 2024-06-28 (×2): qty 2, 30d supply, fill #0
  Filled 2024-08-12 – 2024-08-18 (×2): qty 2, 30d supply, fill #1

## 2024-07-10 ENCOUNTER — Other Ambulatory Visit: Payer: Self-pay

## 2024-07-10 ENCOUNTER — Other Ambulatory Visit (HOSPITAL_COMMUNITY): Payer: Self-pay

## 2024-08-12 ENCOUNTER — Other Ambulatory Visit (HOSPITAL_COMMUNITY): Payer: Self-pay

## 2024-08-14 ENCOUNTER — Other Ambulatory Visit (HOSPITAL_COMMUNITY): Payer: Self-pay

## 2024-08-14 ENCOUNTER — Other Ambulatory Visit: Payer: Self-pay

## 2024-08-18 ENCOUNTER — Other Ambulatory Visit (HOSPITAL_COMMUNITY): Payer: Self-pay

## 2024-08-18 ENCOUNTER — Other Ambulatory Visit: Payer: Self-pay

## 2024-08-28 ENCOUNTER — Other Ambulatory Visit (HOSPITAL_COMMUNITY): Payer: Self-pay
